# Patient Record
Sex: Male | Born: 1985 | Race: White | Hispanic: No | Marital: Single | Smoking: Former smoker
Health system: Southern US, Community
[De-identification: ages and names within clinical notes are randomized; demographics above are authoritative.]

## PROBLEM LIST (undated history)

## (undated) DIAGNOSIS — G8929 Other chronic pain: Secondary | ICD-10-CM

## (undated) HISTORY — PX: TONSILLECTOMY: SUR1361

---

## 2013-05-16 ENCOUNTER — Encounter (HOSPITAL_COMMUNITY): Payer: Self-pay | Admitting: Emergency Medicine

## 2013-05-16 ENCOUNTER — Emergency Department (INDEPENDENT_AMBULATORY_CARE_PROVIDER_SITE_OTHER)
Admission: EM | Admit: 2013-05-16 | Discharge: 2013-05-16 | Disposition: A | Discharge status: Home / Self Care | Payer: Self-pay | Source: Home / Self Care | Attending: Emergency Medicine | Admitting: Emergency Medicine

## 2013-05-16 ENCOUNTER — Emergency Department (INDEPENDENT_AMBULATORY_CARE_PROVIDER_SITE_OTHER): Payer: Self-pay

## 2013-05-16 DIAGNOSIS — J209 Acute bronchitis, unspecified: Secondary | ICD-10-CM

## 2013-05-16 IMAGING — CR DG CHEST 2V
2 series · 2 of 2 positions shown · non-contrast
Comparison: None.

CLINICAL DATA: Coughing, congestion

EXAM:
CHEST  2 VIEW

[view not recorded (1 of 2)]
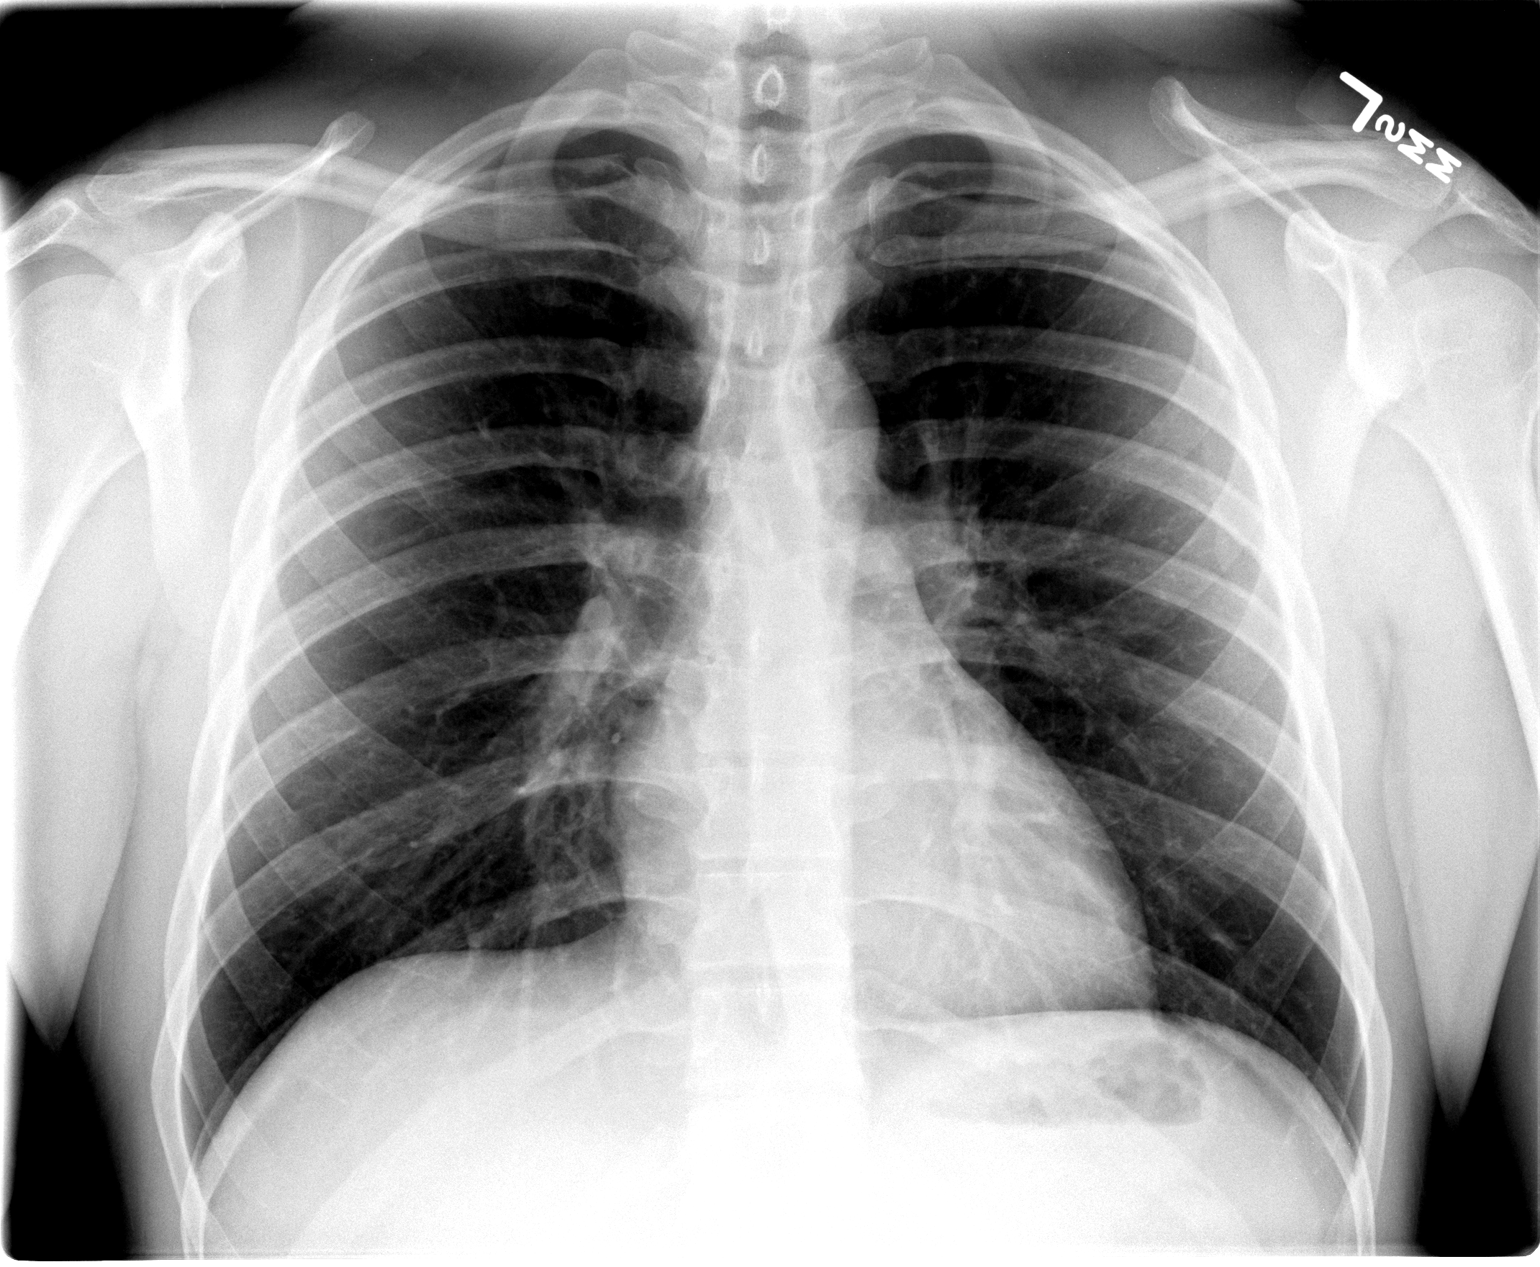

[view not recorded (2 of 2)]
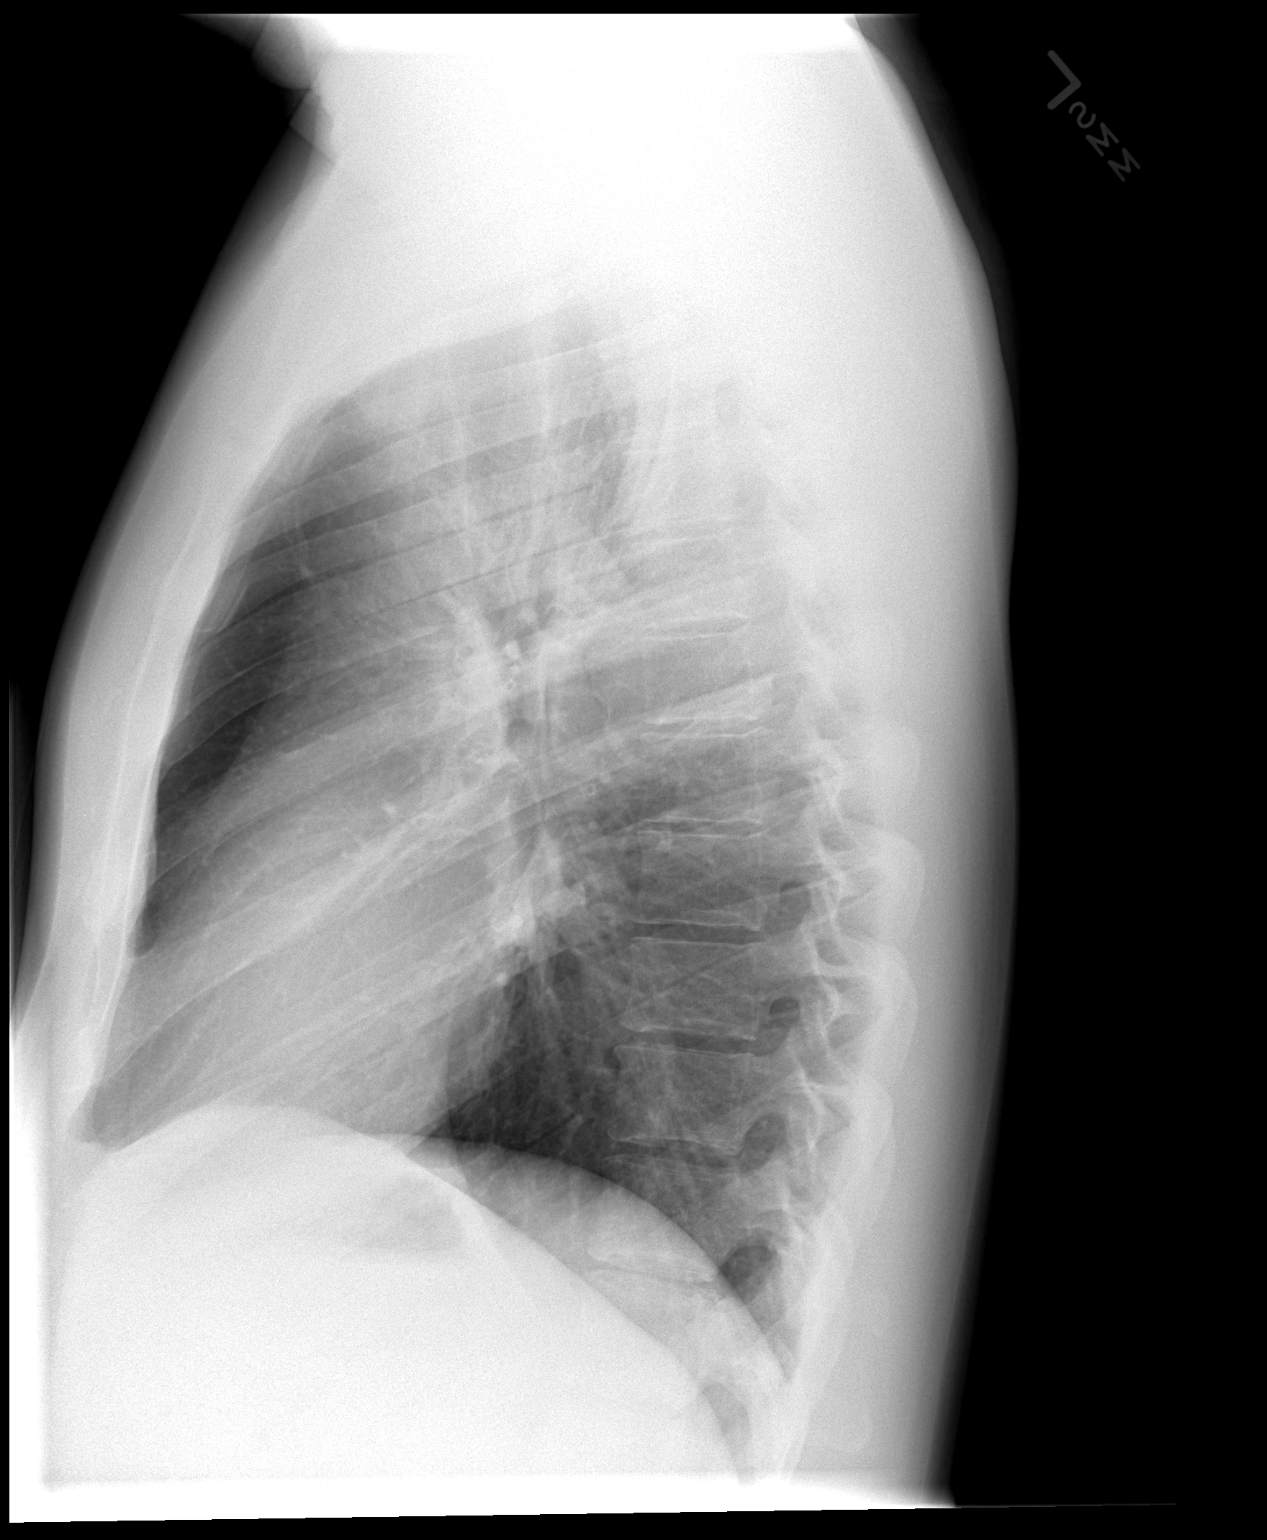

[2 of 2 positions shown; findings below may reference images not displayed]

FINDINGS: The heart size and mediastinal contours are within normal limits.
Both lungs are clear. The visualized skeletal structures are
unremarkable.
IMPRESSION: No active cardiopulmonary disease.

## 2013-05-16 MED ORDER — ALBUTEROL SULFATE HFA 108 (90 BASE) MCG/ACT IN AERS: 1.0000 | INHALATION_SPRAY | Freq: Four times a day (QID) | RESPIRATORY_TRACT | Status: DC | PRN

## 2013-05-16 MED ORDER — IPRATROPIUM BROMIDE 0.06 % NA SOLN: 2.0000 | Freq: Four times a day (QID) | NASAL | Status: DC

## 2013-05-16 MED ORDER — GUAIFENESIN-CODEINE 100-10 MG/5ML PO SYRP: 10.0000 mL | ORAL_SOLUTION | Freq: Four times a day (QID) | ORAL | Status: DC | PRN

## 2013-05-16 MED ORDER — AZITHROMYCIN 250 MG PO TABS: ORAL_TABLET | ORAL | Status: DC

## 2013-05-16 MED ORDER — PREDNISONE 20 MG PO TABS: 20.0000 mg | ORAL_TABLET | Freq: Two times a day (BID) | ORAL | Status: DC

## 2013-05-16 NOTE — ED Notes (Signed)
Pt  Reports  Symptoms  Of  Cough   /  Congested    With  Sinus  Drainage         X  1  Week   Pt   Is  Awake  And  Alert  And  Oriented and  Appears in no  Acute  Distress

## 2013-05-16 NOTE — Discharge Instructions (Signed)
Most upper respiratory infections are caused by viruses and do not require antibiotics.  We try to save the antibiotics for when we really need them to prevent bacteria from developing resistance to them.  Here are a few hints about things that can be done at home to help get over an upper respiratory infection quicker: ° °Get extra sleep and extra fluids.  Get 7 to 9 hours of sleep per night and 6 to 8 glasses of water a day.  Getting extra sleep keeps the immune system from getting run down.  Most people with an upper respiratory infection are a little dehydrated.  The extra fluids also keep the secretions liquified and easier to deal with.  Also, get extra vitamin C.  4000 mg per day is the recommended dose. °For the aches, headache, and fever, acetaminophen or ibuprofen are helpful.  These can be alternated every 4 hours.  People with liver disease should avoid large amounts of acetaminophen, and people with ulcer disease, gastroesophageal reflux, gastritis, congestive heart failure, chronic kidney disease, coronary artery disease and the elderly should avoid ibuprofen. °For nasal congestion try Mucinex-D, or if you're having lots of sneezing or clear nasal drainage use Zyrtec-D. People with high blood pressure can take these if their blood pressure is controlled, if not, it's best to avoid the forms with a "D" (decongestants).  You can use the plain Mucinex, Allegra, Claritin, or Zyrtec even if your blood pressure is not controlled.   °A Saline nasal spray such as Ocean Spray can also help.  You can add a decongestant sprays such as Afrin, but you should not use the decongestant sprays for more than 3 or 4 days since they can be habituating.  Breathe Rite nasal strips can also offer a non-drug alternative treatment to nasal congestion, especially at night. °For people with symptoms of sinusitis, sleeping with your head elevated can be helpful.  For sinus pain, moist, hot compresses to the face may provide some  relief.  Many people find that inhaling steam as in a shower or from a pot of steaming water can help. °For any viral infection, zinc containing lozenges such as Cold-Eze or Zicam are helpful.  Zinc helps to fight viral infection.  Hot salt water gargles (8 oz of hot water, 1/2 tsp of table salt, and a pinch of baking soda) can give relief as well as hot beverages such as hot tea.  Sucrets extra strength lozenges will help the sore throat.  °For the cough, take Delsym 2 tsp every 12 hours.  It has also been found recently that Aleve can help control a cough.  The dose is 1 to 2 tablets twice daily with food.  This can be combined with Delsym. (Note, if you are taking ibuprofen, you should not take Aleve as well--take one or the other.) °A cool mist vaporizer will help keep your mucous membranes from drying out.  ° °It's important when you have an upper respiratory infection not to pass the infection to others.  This involves being very careful about the following: ° °Frequent hand washing or use of hand sanitizer, especially after coughing, sneezing, blowing your nose or touching your face, nose or eyes. °Do not shake hands or touch anyone and try to avoid touching surfaces that other people use such as doorknobs, shopping carts, telephones and computer keyboards. °Use tissues and dispose of them properly in a garbage can or ziplock bag. °Cough into your sleeve. °Do not let others eat or   drink after you. ° °It's also important to recognize the signs of serious illness and get evaluated if they occur: °Any respiratory infection that lasts more than 7 to 10 days.  Yellow nasal drainage and sputum are not reliable indicators of a bacterial infection, but if they last for more than 1 week, see your doctor. °Fever and sore throat can indicate strep. °Fever and cough can indicate influenza or pneumonia. °Any kind of severe symptom such as difficulty breathing, intractable vomiting, or severe pain should prompt you to see  a doctor as soon as possible. ° ° °Your body's immune system is really the thing that will get rid of this infection.  Your immune system is comprised of 2 types of specialized cells called T cells and B cells.  T cells coordinate the array of cells in your body that engulf invading bacteria or viruses while B cells orchestrate the production of antibodies that neutralize infection.  Anything we do or any medications we give you, will just strengthen your immune system or help it clear up the infection quicker.  Here are a few helpful hints to improve your immune system to help overcome this illness or to prevent future infections: °· A few vitamins can improve the health of your immune system.  That's why your diet should include plenty of fruits, vegetables, fish, nuts, and whole grains. °· Vitamin A and bet-carotene can increase the cells that fight infections (T cells and B cells).  Vitamin A is abundant in dark greens and orange vegetables such as spinach, greens, sweet potatoes, and carrots. °· Vitamin B6 contributes to the maturation of white blood cells, the cells that fight disease.  Foods with vitamin B6 include cold cereal and bananas. °· Vitamin C is credited with preventing colds because it increases white blood cells and also prevents cellular damage.  Citrus fruits, peaches and green and red bell peppers are all hight in vitamin C. °· Vitamin E is an anti-oxidant that encourages the production of natural killer cells which reject foreign invaders and B cells that produce antibodies.  Foods high in vitamin E include wheat germ, nuts and seeds. °· Foods high in omega-3 fatty acids found in foods like salmon, tuna and mackerel boost your immune system and help cells to engulf and absorb germs. °· Probiotics are good bacteria that increase your T cells.  These can be found in yogurt and are available in supplements such as Culturelle or Align. °· Moderate exercise increases the strength of your immune  system and your ability to recover from illness.  I suggest 3 to 5 moderate intensity 30 minute workouts per week.   °· Sleep is another component of maintaining a strong immune system.  It enables your body to recuperate from the day's activities, stress and work.  My recommendation is to get between 7 and 9 hours of sleep per night. °· If you smoke, try to quit completely or at least cut down.  Drink alcohol only in moderation if at all.  No more than 2 drinks daily for men or 1 for women. °· Get a flu vaccine early in the fall or if you have not gotten one yet, once this illness has run its course.  If you are over 65, a smoker, or an asthmatic, get a pneumococcal vaccine. °· My final recommendation is to maintain a healthy weight.  Excess weight can impair the immune system by interfering with the way the immune system deals with invading viruses or   bacteria. ° ° °Bronchitis °Bronchitis is inflammation of the airways that extend from the windpipe into the lungs (bronchi). The inflammation often causes mucus to develop, which leads to a cough. If the inflammation becomes severe, it may cause shortness of breath. °CAUSES  °Bronchitis may be caused by:  °· Viral infections.   °· Bacteria.   °· Cigarette smoke.   °· Allergens, pollutants, and other irritants.   °SIGNS AND SYMPTOMS  °The most common symptom of bronchitis is a frequent cough that produces mucus. Other symptoms include: °· Fever.   °· Body aches.   °· Chest congestion.   °· Chills.   °· Shortness of breath.   °· Sore throat.   °DIAGNOSIS  °Bronchitis is usually diagnosed through a medical history and physical exam. Tests, such as chest X-rays, are sometimes done to rule out other conditions.  °TREATMENT  °You may need to avoid contact with whatever caused the problem (smoking, for example). Medicines are sometimes needed. These may include: °· Antibiotics. These may be prescribed if the condition is caused by bacteria. °· Cough suppressants. These may  be prescribed for relief of cough symptoms.   °· Inhaled medicines. These may be prescribed to help open your airways and make it easier for you to breathe.   °· Steroid medicines. These may be prescribed for those with recurrent (chronic) bronchitis. °HOME CARE INSTRUCTIONS °· Get plenty of rest.   °· Drink enough fluids to keep your urine clear or pale yellow (unless you have a medical condition that requires fluid restriction). Increasing fluids may help thin your secretions and will prevent dehydration.   °· Only take over-the-counter or prescription medicines as directed by your health care provider. °· Only take antibiotics as directed. Make sure you finish them even if you start to feel better. °· Avoid secondhand smoke, irritating chemicals, and strong fumes. These will make bronchitis worse. If you are a smoker, quit smoking. Consider using nicotine gum or skin patches to help control withdrawal symptoms. Quitting smoking will help your lungs heal faster.   °· Put a cool-mist humidifier in your bedroom at night to moisten the air. This may help loosen mucus. Change the water in the humidifier daily. You can also run the hot water in your shower and sit in the bathroom with the door closed for 5 10 minutes.   °· Follow up with your health care provider as directed.   °· Wash your hands frequently to avoid catching bronchitis again or spreading an infection to others.   °SEEK MEDICAL CARE IF: °Your symptoms do not improve after 1 week of treatment.  °SEEK IMMEDIATE MEDICAL CARE IF: °· Your fever increases. °· You have chills.   °· You have chest pain.   °· You have worsening shortness of breath.   °· You have bloody sputum. °· You faint.   °· You have lightheadedness. °· You have a severe headache.   °· You vomit repeatedly. °MAKE SURE YOU:  °· Understand these instructions. °· Will watch your condition. °· Will get help right away if you are not doing well or get worse. °Document Released: 03/07/2005  Document Revised: 12/26/2012 Document Reviewed: 10/30/2012 °[employer]® Patient Information ©2014 [employer]. ° °

## 2013-05-16 NOTE — ED Provider Notes (Signed)
Chief Complaint   Chief Complaint  Patient presents with  . URI    History of Present Illness   Scott Green is a 28 year old male who's had a one-week history of cough productive yellow-green sputum with blood, chest tightness, wheezing, posttussive vomiting. He's felt hot and feels like she's about to pass out. He's had nasal congestion with small amounts of clear drainage, headache, sinus pressure, and his ears and popping. He denies any specific sick exposures.  Review of Systems   Other than as noted above, the patient denies any of the following symptoms: Systemic:  No fevers, chills, sweats, or myalgias. Eye:  No redness or discharge. ENT:  No ear pain, headache, nasal congestion, drainage, sinus pressure, or sore throat. Neck:  No neck pain, stiffness, or swollen glands. Lungs:  No cough, sputum production, hemoptysis, wheezing, chest tightness, shortness of breath or chest pain. GI:  No abdominal pain, nausea, vomiting or diarrhea.  PMFSH   Past medical history, family history, social history, meds, and allergies were reviewed.   Physical exam   Vital signs:  BP 110/80  Pulse 84  Temp(Src) 98.3 F (36.8 C) (Oral)  Resp 18  SpO2 98% General:  Alert and oriented.  In no distress.  Skin warm and dry. Eye:  No conjunctival injection or drainage. Lids were normal. ENT:  TMs and canals were normal, without erythema or inflammation.  Nasal mucosa was clear and uncongested, without drainage.  Mucous membranes were moist.  Pharynx was clear with no exudate or drainage.  There were no oral ulcerations or lesions. Neck:  Supple, no adenopathy, tenderness or mass. Lungs:  No respiratory distress.  He has bilateral, scattered expiratory wheezes.  Heart:  Regular rhythm, without gallops, murmers or rubs. Skin:  Clear, warm, and dry, without rash or lesions.  Radiology   Dg Chest 2 View  05/16/2013   CLINICAL DATA:  Coughing, congestion  EXAM: CHEST  2 VIEW  COMPARISON:   None.  FINDINGS: The heart size and mediastinal contours are within normal limits. Both lungs are clear. The visualized skeletal structures are unremarkable.  IMPRESSION: No active cardiopulmonary disease.   Electronically Signed   By: Ruel Favors M.D.   On: 05/16/2013 14:41   Assessment     The encounter diagnosis was Acute bronchitis.  Plan    1.  Meds:  The following meds were prescribed:   Discharge Medication List as of 05/16/2013  3:08 PM    START taking these medications   Details  albuterol (PROVENTIL HFA;VENTOLIN HFA) 108 (90 BASE) MCG/ACT inhaler Inhale 1-2 puffs into the lungs every 6 (six) hours as needed for wheezing or shortness of breath., Starting 05/16/2013, Until Discontinued, Normal    azithromycin (ZITHROMAX Z-PAK) 250 MG tablet Take as directed., Normal    guaiFENesin-codeine (GUIATUSS AC) 100-10 MG/5ML syrup Take 10 mLs by mouth 4 (four) times daily as needed for cough., Starting 05/16/2013, Until Discontinued, Print    ipratropium (ATROVENT) 0.06 % nasal spray Place 2 sprays into both nostrils 4 (four) times daily., Starting 05/16/2013, Until Discontinued, Normal    predniSONE (DELTASONE) 20 MG tablet Take 1 tablet (20 mg total) by mouth 2 (two) times daily., Starting 05/16/2013, Until Discontinued, Normal        2.  Patient Education/Counseling:  The patient was given appropriate handouts, self care instructions, and instructed in symptomatic relief.  Instructed to get extra fluids, rest, and use a cool mist vaporizer.    3.  Follow up:  The patient was told to follow up here if no better in 3 to 4 days, or sooner if becoming worse in any way, and given some red flag symptoms such as increasing fever, difficulty breathing, chest pain, or persistent vomiting which would prompt immediate return.  Follow up here as needed.      Reuben Likesavid C Infant Doane, MD 05/16/13 2121

## 2013-12-15 ENCOUNTER — Emergency Department (HOSPITAL_COMMUNITY)
Admission: EM | Admit: 2013-12-15 | Discharge: 2013-12-16 | Disposition: A | Discharge status: Home / Self Care | Payer: Self-pay | Attending: Emergency Medicine | Admitting: Emergency Medicine

## 2013-12-15 ENCOUNTER — Encounter (HOSPITAL_COMMUNITY): Payer: Self-pay | Admitting: Emergency Medicine

## 2013-12-15 DIAGNOSIS — F1920 Other psychoactive substance dependence, uncomplicated: Secondary | ICD-10-CM | POA: Diagnosis present

## 2013-12-15 DIAGNOSIS — F411 Generalized anxiety disorder: Secondary | ICD-10-CM | POA: Insufficient documentation

## 2013-12-15 DIAGNOSIS — F1910 Other psychoactive substance abuse, uncomplicated: Secondary | ICD-10-CM

## 2013-12-15 DIAGNOSIS — F1510 Other stimulant abuse, uncomplicated: Secondary | ICD-10-CM | POA: Insufficient documentation

## 2013-12-15 DIAGNOSIS — F1210 Cannabis abuse, uncomplicated: Secondary | ICD-10-CM | POA: Insufficient documentation

## 2013-12-15 DIAGNOSIS — F1310 Sedative, hypnotic or anxiolytic abuse, uncomplicated: Secondary | ICD-10-CM | POA: Insufficient documentation

## 2013-12-15 DIAGNOSIS — IMO0002 Reserved for concepts with insufficient information to code with codable children: Secondary | ICD-10-CM | POA: Insufficient documentation

## 2013-12-15 DIAGNOSIS — Z87891 Personal history of nicotine dependence: Secondary | ICD-10-CM | POA: Insufficient documentation

## 2013-12-15 DIAGNOSIS — F1410 Cocaine abuse, uncomplicated: Secondary | ICD-10-CM | POA: Insufficient documentation

## 2013-12-15 LAB — RAPID URINE DRUG SCREEN, HOSP PERFORMED
Amphetamines: POSITIVE — AB
BARBITURATES: NOT DETECTED
BENZODIAZEPINES: POSITIVE — AB
COCAINE: NOT DETECTED
Opiates: NOT DETECTED
Tetrahydrocannabinol: POSITIVE — AB

## 2013-12-15 LAB — COMPREHENSIVE METABOLIC PANEL
ALT: 195 U/L — AB (ref 0–53)
AST: 66 U/L — ABNORMAL HIGH (ref 0–37)
Albumin: 4.9 g/dL (ref 3.5–5.2)
Alkaline Phosphatase: 57 U/L (ref 39–117)
Anion gap: 20 — ABNORMAL HIGH (ref 5–15)
BUN: 24 mg/dL — AB (ref 6–23)
CALCIUM: 10.1 mg/dL (ref 8.4–10.5)
CO2: 20 meq/L (ref 19–32)
Chloride: 99 mEq/L (ref 96–112)
Creatinine, Ser: 1.2 mg/dL (ref 0.50–1.35)
GFR, EST NON AFRICAN AMERICAN: 82 mL/min — AB (ref >90)
GLUCOSE: 101 mg/dL — AB (ref 70–99)
Potassium: 3.8 mEq/L (ref 3.7–5.3)
SODIUM: 139 meq/L (ref 137–147)
Total Bilirubin: 0.7 mg/dL (ref 0.3–1.2)
Total Protein: 8.8 g/dL — ABNORMAL HIGH (ref 6.0–8.3)

## 2013-12-15 LAB — ETHANOL: Alcohol, Ethyl (B): <11 mg/dL (ref 0–11)

## 2013-12-15 LAB — SALICYLATE LEVEL

## 2013-12-15 LAB — ACETAMINOPHEN LEVEL: Acetaminophen (Tylenol), Serum: <15 ug/mL (ref 10–30)

## 2013-12-15 MED ORDER — ONDANSETRON HCL 4 MG PO TABS: 4.0000 mg | ORAL_TABLET | Freq: Three times a day (TID) | ORAL | Status: DC | PRN

## 2013-12-15 MED ORDER — NICOTINE 21 MG/24HR TD PT24
21.0000 mg | MEDICATED_PATCH | Freq: Every day | TRANSDERMAL | Status: DC
  Administered 2013-12-16: 21 mg via TRANSDERMAL
  Filled 2013-12-15 (×2): qty 1

## 2013-12-15 MED ORDER — IBUPROFEN 200 MG PO TABS
600.0000 mg | ORAL_TABLET | Freq: Three times a day (TID) | ORAL | Status: DC | PRN
  Administered 2013-12-16: 600 mg via ORAL
  Filled 2013-12-15: qty 3

## 2013-12-15 MED ORDER — LORAZEPAM 1 MG PO TABS
1.0000 mg | ORAL_TABLET | Freq: Three times a day (TID) | ORAL | Status: DC | PRN
  Administered 2013-12-15: 1 mg via ORAL
  Filled 2013-12-15: qty 1

## 2013-12-15 MED ORDER — ALUM & MAG HYDROXIDE-SIMETH 200-200-20 MG/5ML PO SUSP: 30.0000 mL | ORAL | Status: DC | PRN

## 2013-12-15 MED ORDER — ACETAMINOPHEN 325 MG PO TABS: 650.0000 mg | ORAL_TABLET | ORAL | Status: DC | PRN

## 2013-12-15 MED ORDER — ZOLPIDEM TARTRATE 5 MG PO TABS
5.0000 mg | ORAL_TABLET | Freq: Every evening | ORAL | Status: DC | PRN
  Administered 2013-12-16: 5 mg via ORAL
  Filled 2013-12-15: qty 1

## 2013-12-15 NOTE — ED Notes (Signed)
Patient wife and mother are concerned patient is using Meth and additional drugs that patient is not disclosing. Patient wife tearful when discussing concerns for safety of self or child but denies concerns.

## 2013-12-15 NOTE — ED Notes (Signed)
PA at bedside.

## 2013-12-15 NOTE — ED Notes (Addendum)
Patient states his is here for drug treatment. Patient states he smokes marijuana and uses "uppers". Patient states he had a daughter born on 12/02/2013 and this is his motivation for treatment. Patient states he went through a detox program @ 5years ago at [facility]. Patient is speaking fast and unable to focus on his thoughts. Patient denies SI/HI. Patient reports he is currently enrolled in court ordered anger management classes (last class scheduled for Tuesday). Patient states he feels like these have been ineffective and would also like to talk to someone about anger management. Patient states he last used at noon.

## 2013-12-15 NOTE — ED Provider Notes (Signed)
CSN: [id number]     Arrival date & time 12/15/13  2012 History   First MD Initiated Contact with Patient 12/15/13 2136     This chart was scribed for non-physician practitioner, Antony Madura, PA-C working with No att. providers found by Arlan Organ, ED Scribe. This patient was seen in room WA32/WA32 and the patient's care was started at 4:47 AM.   Chief Complaint  Patient presents with  . drug detox    The history is provided by the patient and the spouse. No language interpreter was used.    HPI Comments: Scott Green is a 28 y.o. male who presents to the Emergency Department here for drug detox today. Pt admits to occasional cocaine use along with Marijuana use. States he snorts both Adderall and Cocaine. Last use of Xanex, Adderall, and Marijuana earlier today. Mr. Justiss went through a detox program approximately 5 years ago at [facility]. When not using he denies any withdrawal or worsening anger. However, he states he feels more agitated when using cocaine. Pt and his wife recently had first child 9/14 which is motivating him to seek treatment. He also admits to anger issues. He states he often takes his anger out on his wife. He is currently enrolled in a court ordered anger management classes; last class scheduled for 9/29. No SI/HI. No other illicit drug use.  History reviewed. No pertinent past medical history. History reviewed. No pertinent past surgical history. No family history on file. History  Substance Use Topics  . Smoking status: Former Games developer  . Smokeless tobacco: Current User    Types: Snuff  . Alcohol Use: 7.2 oz/week    12 Cans of beer per week    Review of Systems  Psychiatric/Behavioral: Positive for behavioral problems, decreased concentration and agitation. Negative for suicidal ideas. The patient is nervous/anxious.   All other systems reviewed and are negative.   Allergies  Tramadol  Home Medications   Prior to Admission medications   Not on File    Triage Vitals: BP 101/78  Pulse 112  Temp(Src) 98.2 F (36.8 C) (Oral)  Resp 16  Ht  (1.702 m)  Wt 172 lb 9.6 oz (78.291 kg)  BMI 27.03 kg/m2  SpO2 99%   Physical Exam  Nursing note and vitals reviewed. Constitutional: He is oriented to person, place, and time. He appears well-developed and well-nourished. No distress.  HENT:  Head: Normocephalic and atraumatic.  Eyes: Conjunctivae and EOM are normal. No scleral icterus.  Neck: Normal range of motion. Neck supple.  Pulmonary/Chest: Effort normal. No respiratory distress.  Musculoskeletal: Normal range of motion.  Neurological: He is alert and oriented to person, place, and time. He exhibits normal muscle tone. Coordination normal.  GCS 15. Speech is goal oriented. Patient moves extremities without ataxia.  Skin: Skin is warm and dry. No rash noted. He is not diaphoretic. No erythema. No pallor.  Psychiatric: His mood appears anxious. His speech is rapid and/or pressured. He is agitated. Cognition and memory are normal. He expresses no homicidal and no suicidal ideation. He expresses no suicidal plans and no homicidal plans.    ED Course  Procedures (including critical care time)  DIAGNOSTIC STUDIES: Oxygen Saturation is 99% on RA, Normal by my interpretation.    COORDINATION OF CARE: 4:47 AM- Will order CBC, CMP, urine rapid drug screen, ethanol, acetaminophen levels, and salicylate levels. Discussed treatment plan with pt at bedside and pt agreed to plan.     Labs Review Labs Reviewed  CBC WITH DIFFERENTIAL - Abnormal; Notable for the following:    WBC 12.9 (*)    Neutro Abs 8.1 (*)    Monocytes Absolute 1.4 (*)    All other components within normal limits  COMPREHENSIVE METABOLIC PANEL - Abnormal; Notable for the following:    Glucose, Bld 101 (*)    BUN 24 (*)    Total Protein 8.8 (*)    AST 66 (*)    ALT 195 (*)    GFR calc non Af Amer 82 (*)    Anion gap 20 (*)    All other components within normal  limits  URINE RAPID DRUG SCREEN (HOSP PERFORMED) - Abnormal; Notable for the following:    Benzodiazepines POSITIVE (*)    Amphetamines POSITIVE (*)    Tetrahydrocannabinol POSITIVE (*)    All other components within normal limits  SALICYLATE LEVEL - Abnormal; Notable for the following:    Salicylate Lvl <2.0 (*)    All other components within normal limits  ETHANOL  ACETAMINOPHEN LEVEL    Imaging Review No results found.   EKG Interpretation None      MDM   Final diagnoses:  Polysubstance abuse  Difficulty controlling anger    28 year old male presents to the emergency department for further evaluation of polysubstance abuse. Patient also with history of anger outbursts and difficulty controlling his anger. Patient has been going to anger management, but this has not been helping him. Patient endorses only using marijuana and Adderall or Xanax. Girlfriend suspects use of methamphetamines. Patient denies this; however, UDS today is positive for amphetamines. Patient denies SI/HI. No alcohol use. Patient has been evaluated by TTS and meets criteria for inpatient treatment. Patient pending placement at this time. Anion gap elevated; electrolytes and bicarb normal. Believe patient can be medically cleared if anion gap on repeat chem-8 improves. Disposition to be determined by oncoming ED provider.  I personally performed the services described in this documentation, which was scribed in my presence. The recorded information has been reviewed and is accurate.    Filed Vitals:   12/15/13 2028  BP: 101/78  Pulse: 112  Temp: 98.2 F (36.8 C)  TempSrc: Oral  Resp: 16  Height:  (1.702 m)  Weight: 172 lb 9.6 oz (78.291 kg)  SpO2: 99%     Antony Madura, PA-C 12/16/13 0451

## 2013-12-15 NOTE — ED Notes (Signed)
Per pt., brother Sabatino Williard needs to be called when pt is moved and/or dispo'd. [phone] (c)

## 2013-12-15 NOTE — ED Notes (Signed)
TTS at bedside. 

## 2013-12-16 ENCOUNTER — Encounter (HOSPITAL_COMMUNITY): Payer: Self-pay | Admitting: Psychiatry

## 2013-12-16 DIAGNOSIS — F1920 Other psychoactive substance dependence, uncomplicated: Secondary | ICD-10-CM | POA: Diagnosis present

## 2013-12-16 DIAGNOSIS — F1910 Other psychoactive substance abuse, uncomplicated: Secondary | ICD-10-CM

## 2013-12-16 LAB — I-STAT CHEM 8, ED
BUN: 28 mg/dL — AB (ref 6–23)
Calcium, Ion: 1.14 mmol/L (ref 1.12–1.23)
Chloride: 103 mEq/L (ref 96–112)
Creatinine, Ser: 1.2 mg/dL (ref 0.50–1.35)
Glucose, Bld: 125 mg/dL — ABNORMAL HIGH (ref 70–99)
HCT: 46 % (ref 39.0–52.0)
Hemoglobin: 15.6 g/dL (ref 13.0–17.0)
POTASSIUM: 3.3 meq/L — AB (ref 3.7–5.3)
SODIUM: 138 meq/L (ref 137–147)
TCO2: 26 mmol/L (ref 0–100)

## 2013-12-16 LAB — CBC WITH DIFFERENTIAL/PLATELET
BASOS PCT: 0 % (ref 0–1)
Basophils Absolute: 0 10*3/uL (ref 0.0–0.1)
Eosinophils Absolute: 0 10*3/uL (ref 0.0–0.7)
Eosinophils Relative: 0 % (ref 0–5)
HCT: 46.7 % (ref 39.0–52.0)
HEMOGLOBIN: 16.7 g/dL (ref 13.0–17.0)
Lymphocytes Relative: 26 % (ref 12–46)
Lymphs Abs: 3.4 10*3/uL (ref 0.7–4.0)
MCH: 31.9 pg (ref 26.0–34.0)
MCHC: 35.8 g/dL (ref 30.0–36.0)
MCV: 89.1 fL (ref 78.0–100.0)
MONO ABS: 1.4 10*3/uL — AB (ref 0.1–1.0)
Monocytes Relative: 11 % (ref 3–12)
NEUTROS PCT: 63 % (ref 43–77)
Neutro Abs: 8.1 10*3/uL — ABNORMAL HIGH (ref 1.7–7.7)
Platelets: 224 10*3/uL (ref 150–400)
RBC: 5.24 MIL/uL (ref 4.22–5.81)
RDW: 13.2 % (ref 11.5–15.5)
WBC: 12.9 10*3/uL — ABNORMAL HIGH (ref 4.0–10.5)

## 2013-12-16 MED ORDER — POTASSIUM CHLORIDE CRYS ER 20 MEQ PO TBCR
40.0000 meq | EXTENDED_RELEASE_TABLET | Freq: Once | ORAL | Status: AC
  Administered 2013-12-16: 40 meq via ORAL
  Filled 2013-12-16: qty 2

## 2013-12-16 MED ORDER — LORAZEPAM 1 MG PO TABS
1.0000 mg | ORAL_TABLET | Freq: Once | ORAL | Status: AC
  Administered 2013-12-16: 1 mg via ORAL
  Filled 2013-12-16: qty 1

## 2013-12-16 NOTE — Progress Notes (Signed)
Belongings in locker 32 

## 2013-12-16 NOTE — ED Notes (Signed)
Pt discharged home with gf; given all information for rehab and detox; stated understanding; no issues at time of discharge

## 2013-12-16 NOTE — Discharge Instructions (Signed)
Chemical Dependency Chemical dependency is an addiction to drugs or alcohol. It is characterized by the repeated behavior of seeking out and using drugs and alcohol despite harmful consequences to the health and safety of ones self and others.  RISK FACTORS There are certain situations or behaviors that increase a person's risk for chemical dependency. These include:  A family history of chemical dependency.  A history of mental health issues, including depression and anxiety.  A home environment where drugs and alcohol are easily available to you.  Drug or alcohol use at a young age. SYMPTOMS  The following symptoms can indicate chemical dependency:  Inability to limit the use of drugs or alcohol.  Nausea, sweating, shakiness, and anxiety that occurs when alcohol or drugs are not being used.  An increase in amount of drugs or alcohol that is necessary to get drunk or high. People who experience these symptoms can assess their use of drugs and alcohol by asking themselves the following questions:  Have you been told by friends or family that they are worried about your use of alcohol or drugs?  Do friends and family ever tell you about things you did while drinking alcohol or using drugs that you do not remember?  Do you lie about using alcohol or drugs or about the amounts you use?  Do you have difficulty completing daily tasks unless you use alcohol or drugs?  Is the level of your work or school performance lower because of your drug or alcohol use?  Do you get sick from using drugs or alcohol but keep using anyway?  Do you feel uncomfortable in social situations unless you use alcohol or drugs?  Do you use drugs or alcohol to help forget problems? An answer of yes to any of these questions may indicate chemical dependency. Professional evaluation is suggested. Document Released: 03/01/2001 Document Revised: 05/30/2011 Document Reviewed: 05/13/2010 [facility] Patient  Information 2015 [place], [place]. This information is not intended to replace advice given to you by your health care provider. Make sure you discuss any questions you have with your health care provider.  Finding Treatment for Alcohol and Drug Addiction It can be hard to find the right place to get professional treatment. Here are some important things to consider:  There are different types of treatment to choose from.  Some programs are live-in (residential) while others are not (outpatient). Sometimes a combination is offered.  No single type of program is right for everyone.  Most treatment programs involve a combination of education, counseling, and a 12-step, spiritually-based approach.  There are non-spiritually based programs (not 12-step).  Some treatment programs are government sponsored. They are geared for patients without private insurance.  Treatment programs can vary in many respects such as:  Cost and types of insurance accepted.  Types of on-site medical services offered.  Length of stay, setting, and size.  Overall philosophy of treatment. A person may need specialized treatment or have needs not addressed by all programs. For example, adolescents need treatment appropriate for their age. Other people have secondary disorders that must be managed as well. Secondary conditions can include mental illness, such as depression or diabetes. Often, a period of detoxification from alcohol or drugs is needed. This requires medical supervision and not all programs offer this. THINGS TO CONSIDER WHEN SELECTING A TREATMENT PROGRAM   Is the program certified by the appropriate government agency? Even private programs must be certified and employ certified professionals.  Does the program accept your insurance?  If not, can a payment plan be set up?  Is the facility clean, organized, and well run? Do they allow you to speak with graduates who can share their treatment experience  with you? Can you tour the facility? Can you meet with staff?  Does the program meet the full range of individual needs?  Does the treatment program address sexual orientation and physical disabilities? Do they provide age, gender, and culturally appropriate treatment services?  Is treatment available in languages other than English?  Is long-term aftercare support or guidance encouraged and provided?  Is assessment of an individual's treatment plan ongoing to ensure it meets changing needs?  Does the program use strategies to encourage reluctant patients to remain in treatment long enough to increase the likelihood of success?  Does the program offer counseling (individual or group) and other behavioral therapies?  Does the program offer medicine as part of the treatment regimen, if needed?  Is there ongoing monitoring of possible relapse? Is there a defined relapse prevention program? Are services or referrals offered to family members to ensure they understand addiction and the recovery process? This would help them support the recovering individual.  Are 12-step meetings held at the center or is transport available for patients to attend outside meetings? In countries outside of the [place]. and [place], Magazine features editor for contact information for services in your area. Document Released: 02/03/2005 Document Revised: 05/30/2011 Document Reviewed: 08/16/2007 [facility] Patient Information 2015 [place], [place]. This information is not intended to replace advice given to you by your health care provider. Make sure you discuss any questions you have with your health care provider.

## 2013-12-16 NOTE — [facility] Counselor (Signed)
Pt is being d/c. Writer gave pt list of outpatient and inpatient substance abuse treatment centers.  Evette Cristal, [place] Assessment Counselor

## 2013-12-16 NOTE — [facility] Assessment (Signed)
Assessment completed. Consulted with Nanine Means, NP who recommended inpatient treatment. [facility] at capacity. TTS will contact other facilities for placement. Informed Antony Madura, PA-C of recommendation.

## 2013-12-16 NOTE — [facility] Suicide Risk Assessment (Signed)
Suicide Risk Assessment  Discharge Assessment     Demographic Factors:  Male and Caucasian  Total Time spent with patient: 20 minutes  Psychiatric Specialty Exam:     Blood pressure 102/68, pulse 102, temperature 97.9 F (36.6 C), temperature source Oral, resp. rate 16, height  (1.702 m), weight 172 lb 9.6 oz (78.291 kg), SpO2 99.00%.Body mass index is 27.03 kg/(m^2).  General Appearance: Casual  Eye Contact::  Good  Speech:  Normal Rate  Volume:  Normal  Mood:  Euthymic  Affect:  Congruent  Thought Process:  Coherent  Orientation:  Full (Time, Place, and Person)  Thought Content:  WDL  Suicidal Thoughts:  No  Homicidal Thoughts:  No  Memory:  Immediate;   Good Recent;   Good Remote;   Good  Judgement:  Fair  Insight:  Fair  Psychomotor Activity:  Normal  Concentration:  Good  Recall:  Good  Fund of Knowledge:Good  Language: Good  Akathisia:  No  Handed:  Right  AIMS (if indicated):     Assets:  Communication Skills Desire for Improvement Housing Intimacy Leisure Time Physical Health Resilience Social Support Talents/Skills Transportation Vocational/Educational  Sleep:      Musculoskeletal: Strength & Muscle Tone: within normal limits Gait & Station: normal Patient leans: N/A  Mental Status Per Nursing Assessment::   On Admission:   polysubstance abuse/dependence  Current Mental Status by Physician: NA  Loss Factors: NA  Historical Factors: NA  Risk Reduction Factors:   Responsible for children under 78 years of age, Sense of responsibility to family, Employed, Living with another person, especially a relative and Positive social support  Continued Clinical Symptoms:  Polysubstance abuse  Cognitive Features That Contribute To Risk:  None  Suicide Risk:  Minimal: No identifiable suicidal ideation.  Patients presenting with no risk factors but with morbid ruminations; may be classified as minimal risk based on the severity of the  depressive symptoms  Discharge Diagnoses:   AXIS I:  Polysubstance abuse/dependence AXIS II:  Deferred AXIS III:  History reviewed. No pertinent past medical history. AXIS IV:  problems related to legal system/crime and problems related to social environment AXIS V:  61-70 mild symptoms  Plan Of Care/Follow-up recommendations:  Activity:  as tolerated Diet:  low-sodium heart healthy diet  Is patient on multiple antipsychotic therapies at discharge:  No   Has Patient had three or more failed trials of antipsychotic monotherapy by history:  No  Recommended Plan for Multiple Antipsychotic Therapies: NA    Majid Mccravy, PMH-NP 12/16/2013, 11:12 AM

## 2013-12-16 NOTE — ED Provider Notes (Signed)
Medical screening examination/treatment/procedure(s) were performed by non-physician practitioner and as supervising physician I was immediately available for consultation/collaboration.   EKG Interpretation None      Glynn Octave, MD 12/16/13 [phone]

## 2013-12-16 NOTE — Consult Note (Signed)
[facility] Face-to-Face Psychiatry Consult   Reason for Consult:  Polysubstance abuse/dependence Referring Physician:  EDP  Scott Green is an 28 y.o. male. Total Time spent with patient: 20 minutes  Assessment: AXIS I:  Substance Abuse AXIS II:  Deferred AXIS III:  History reviewed. No pertinent past medical history. AXIS IV:  problems related to legal system/crime and problems related to social environment AXIS V:  61-70 mild symptoms  Plan:  No evidence of imminent risk to self or others at present.  Dr. Darleene Cleaver assessed the patient and concurs with the plan.  Subjective:   Scott Green is a 28 y.o. male patient does not warrant admission.  HPI:  Patient stated he came to the ED because he has a new girlfriend and a new baby he wants to get "clean" for.  He stated he uses marijuana daily, Xanax on the weekend, occasional alcohol use, denies withdrawal symptoms.  He would like rehab resources to get help.  Denies suicidal/homicidal ideations, hallucinations.  He has a court date on Wednesday, 10/30 for charges.   HPI Elements:   Location:  generalized. Quality:  chronic. Severity:  mild. Timing:  intermittent. Duration:  years. Context:  stressors.  Past Psychiatric History: History reviewed. No pertinent past medical history.  reports that he has quit smoking. His smokeless tobacco use includes Snuff. He reports that he drinks about 7.2 ounces of alcohol per week. He reports that he uses illicit drugs (Marijuana). History reviewed. No pertinent family history. Family History Substance Abuse: No Family Supports: Yes, List: (Girlfriend ) Living Arrangements: Spouse/significant other Can pt return to current living arrangement?: Yes Abuse/Neglect [facility]) Physical Abuse: Denies Verbal Abuse: Denies Sexual Abuse: Denies Allergies:   Allergies  Allergen Reactions  . Tramadol Other (See Comments)    Stomach pain    ACT Assessment Complete:  Yes:    Educational Status     Risk to Self: Risk to self with the past 6 months Suicidal Ideation: No Suicidal Intent: No Is patient at risk for suicide?: No Suicidal Plan?: No Access to Means: No What has been your use of drugs/alcohol within the last 12 months?: Daily drug use reported. Denies alcohol use Previous Attempts/Gestures: No How many times?: 0 Other Self Harm Risks: No other self harm risk identified at this time.  Triggers for Past Attempts: None known Intentional Self Injurious Behavior: None Family Suicide History: No Recent stressful life event(s):  (No stressful events reported) Persecutory voices/beliefs?: No Depression: No Depression Symptoms: Insomnia;Feeling angry/irritable;Guilt Substance abuse history and/or treatment for substance abuse?: Yes Suicide prevention information given to non-admitted patients: Not applicable  Risk to Others: Risk to Others within the past 6 months Homicidal Ideation: No Thoughts of Harm to Others: No Current Homicidal Intent: No Current Homicidal Plan: No Access to Homicidal Means: No Identified Victim: NA History of harm to others?: No Assessment of Violence: None Noted Violent Behavior Description: Pt is calm and cooperative at this time Does patient have access to weapons?: No Criminal Charges Pending?: No Does patient have a court date: Yes Court Date: 12/18/13 (Completion of anger management classes.)  Abuse: Abuse/Neglect Assessment (Assessment to be complete while patient is alone) Physical Abuse: Denies Verbal Abuse: Denies Sexual Abuse: Denies Exploitation of patient/patient's resources: Denies Self-Neglect: Denies  Prior Inpatient Therapy: Prior Inpatient Therapy Prior Inpatient Therapy: Yes Prior Therapy Dates: 2010 Prior Therapy Facilty/Provider(s): [facility] Reason for Treatment: Detox   Prior Outpatient Therapy: Prior Outpatient Therapy Prior Outpatient Therapy: Yes Prior Therapy Dates: 2010  Prior Therapy Facilty/Provider(s): [place],  [place]  Reason for Treatment: SA  Additional Information: Additional Information 1:1 In Past 12 Months?: No CIRT Risk: No Elopement Risk: No Does patient have medical clearance?: Yes                  Objective: Blood pressure 102/68, pulse 102, temperature 97.9 F (36.6 C), temperature source Oral, resp. rate 16, height 5' 7"  (1.702 m), weight 172 lb 9.6 oz (78.291 kg), SpO2 99.00%.Body mass index is 27.03 kg/(m^2). Results for orders placed during the hospital encounter of 12/15/13 (from the past 72 hour(s))  CBC WITH DIFFERENTIAL     Status: Abnormal   Collection Time    12/15/13 10:24 PM      Result Value Ref Range   WBC 12.9 (*) 4.0 - 10.5 K/uL   RBC 5.24  4.22 - 5.81 MIL/uL   Hemoglobin 16.7  13.0 - 17.0 g/dL   HCT 46.7  39.0 - 52.0 %   MCV 89.1  78.0 - 100.0 fL   MCH 31.9  26.0 - 34.0 pg   MCHC 35.8  30.0 - 36.0 g/dL   RDW 13.2  11.5 - 15.5 %   Platelets 224  150 - 400 K/uL   Neutrophils Relative % 63  43 - 77 %   Lymphocytes Relative 26  12 - 46 %   Monocytes Relative 11  3 - 12 %   Eosinophils Relative 0  0 - 5 %   Basophils Relative 0  0 - 1 %   Neutro Abs 8.1 (*) 1.7 - 7.7 K/uL   Lymphs Abs 3.4  0.7 - 4.0 K/uL   Monocytes Absolute 1.4 (*) 0.1 - 1.0 K/uL   Eosinophils Absolute 0.0  0.0 - 0.7 K/uL   Basophils Absolute 0.0  0.0 - 0.1 K/uL   Smear Review MORPHOLOGY UNREMARKABLE    COMPREHENSIVE METABOLIC PANEL     Status: Abnormal   Collection Time    12/15/13 10:24 PM      Result Value Ref Range   Sodium 139  137 - 147 mEq/L   Potassium 3.8  3.7 - 5.3 mEq/L   Chloride 99  96 - 112 mEq/L   CO2 20  19 - 32 mEq/L   Glucose, Bld 101 (*) 70 - 99 mg/dL   BUN 24 (*) 6 - 23 mg/dL   Creatinine, Ser 1.20  0.50 - 1.35 mg/dL   Calcium 10.1  8.4 - 10.5 mg/dL   Total Protein 8.8 (*) 6.0 - 8.3 g/dL   Albumin 4.9  3.5 - 5.2 g/dL   AST 66 (*) 0 - 37 U/L   ALT 195 (*) 0 - 53 U/L   Alkaline Phosphatase 57  39 - 117 U/L   Total Bilirubin 0.7  0.3 - 1.2 mg/dL   GFR  calc non Af Amer 82 (*) >90 mL/min   GFR calc Af Amer >90  >90 mL/min   Comment: (NOTE)     The eGFR has been calculated using the CKD EPI equation.     This calculation has not been validated in all clinical situations.     eGFR's persistently <90 mL/min signify possible Chronic Kidney     Disease.   Anion gap 20 (*) 5 - 15  ETHANOL     Status: None   Collection Time    12/15/13 10:24 PM      Result Value Ref Range   Alcohol, Ethyl (B) <11  0 - 11 mg/dL  Comment:            LOWEST DETECTABLE LIMIT FOR     SERUM ALCOHOL IS 11 mg/dL     FOR MEDICAL PURPOSES ONLY  ACETAMINOPHEN LEVEL     Status: None   Collection Time    12/15/13 10:24 PM      Result Value Ref Range   Acetaminophen (Tylenol), Serum <15.0  10 - 30 ug/mL   Comment:            THERAPEUTIC CONCENTRATIONS VARY     SIGNIFICANTLY. A RANGE OF 10-30     ug/mL MAY BE AN EFFECTIVE     CONCENTRATION FOR MANY PATIENTS.     HOWEVER, SOME ARE BEST TREATED     AT CONCENTRATIONS OUTSIDE THIS     RANGE.     ACETAMINOPHEN CONCENTRATIONS     >150 ug/mL AT 4 HOURS AFTER     INGESTION AND >50 ug/mL AT 12     HOURS AFTER INGESTION ARE     OFTEN ASSOCIATED WITH TOXIC     REACTIONS.  SALICYLATE LEVEL     Status: Abnormal   Collection Time    12/15/13 10:24 PM      Result Value Ref Range   Salicylate Lvl <0.9 (*) 2.8 - 20.0 mg/dL  URINE RAPID DRUG SCREEN (HOSP PERFORMED)     Status: Abnormal   Collection Time    12/15/13 11:14 PM      Result Value Ref Range   Opiates NONE DETECTED  NONE DETECTED   Cocaine NONE DETECTED  NONE DETECTED   Benzodiazepines POSITIVE (*) NONE DETECTED   Amphetamines POSITIVE (*) NONE DETECTED   Tetrahydrocannabinol POSITIVE (*) NONE DETECTED   Barbiturates NONE DETECTED  NONE DETECTED   Comment:            DRUG SCREEN FOR MEDICAL PURPOSES     ONLY.  IF CONFIRMATION IS NEEDED     FOR ANY PURPOSE, NOTIFY LAB     WITHIN 5 DAYS.                LOWEST DETECTABLE LIMITS     FOR URINE DRUG SCREEN      Drug Class       Cutoff (ng/mL)     Amphetamine      1000     Barbiturate      200     Benzodiazepine   604     Tricyclics       540     Opiates          300     Cocaine          300     THC              50  I-STAT CHEM 8, ED     Status: Abnormal   Collection Time    12/16/13  5:56 AM      Result Value Ref Range   Sodium 138  137 - 147 mEq/L   Potassium 3.3 (*) 3.7 - 5.3 mEq/L   Chloride 103  96 - 112 mEq/L   BUN 28 (*) 6 - 23 mg/dL   Creatinine, Ser 1.20  0.50 - 1.35 mg/dL   Glucose, Bld 125 (*) 70 - 99 mg/dL   Calcium, Ion 1.14  1.12 - 1.23 mmol/L   TCO2 26  0 - 100 mmol/L   Hemoglobin 15.6  13.0 - 17.0 g/dL   HCT 46.0  39.0 - 52.0 %  Labs are reviewed and are pertinent for no medical issues noted except hypokalemia, treated..  Current Facility-Administered Medications  Medication Dose Route Frequency Provider Last Rate Last Dose  . acetaminophen (TYLENOL) tablet 650 mg  650 mg Oral Q4H PRN Antonietta Breach, PA-C      . alum & mag hydroxide-simeth (MAALOX/MYLANTA) 200-200-20 MG/5ML suspension 30 mL  30 mL Oral PRN Antonietta Breach, PA-C      . ibuprofen (ADVIL,MOTRIN) tablet 600 mg  600 mg Oral Q8H PRN Antonietta Breach, PA-C   600 mg at 12/16/13 0055  . LORazepam (ATIVAN) tablet 1 mg  1 mg Oral Q8H PRN Antonietta Breach, PA-C   1 mg at 12/15/13 2332  . nicotine (NICODERM CQ - dosed in mg/24 hours) patch 21 mg  21 mg Transdermal Daily Antonietta Breach, PA-C   21 mg at 12/16/13 0108  . ondansetron (ZOFRAN) tablet 4 mg  4 mg Oral Q8H PRN Antonietta Breach, PA-C      . potassium chloride SA (K-DUR,KLOR-CON) CR tablet 40 mEq  40 mEq Oral Once Waylan Boga, NP      . zolpidem (AMBIEN) tablet 5 mg  5 mg Oral QHS PRN Antonietta Breach, PA-C   5 mg at 12/16/13 0055   No current outpatient prescriptions on file.    Psychiatric Specialty Exam:     Blood pressure 102/68, pulse 102, temperature 97.9 F (36.6 C), temperature source Oral, resp. rate 16, height 5' 7"  (1.702 m), weight 172 lb 9.6 oz (78.291 kg), SpO2  99.00%.Body mass index is 27.03 kg/(m^2).  General Appearance: Casual  Eye Contact::  Good  Speech:  Normal Rate  Volume:  Normal  Mood:  Euthymic  Affect:  Congruent  Thought Process:  Coherent  Orientation:  Full (Time, Place, and Person)  Thought Content:  WDL  Suicidal Thoughts:  No  Homicidal Thoughts:  No  Memory:  Immediate;   Good Recent;   Good Remote;   Good  Judgement:  Fair  Insight:  Fair  Psychomotor Activity:  Normal  Concentration:  Good  Recall:  Good  Fund of Knowledge:Good  Language: Good  Akathisia:  No  Handed:  Right  AIMS (if indicated):     Assets:  Communication Skills Desire for [place] Talents/Skills Transportation Vocational/Educational  Sleep:      Musculoskeletal: Strength & Muscle Tone: within normal limits Gait & Station: normal Patient leans: N/A  Treatment Plan Summary: Discharge home with follow-up rehab resources given.  Waylan Boga, [place] 12/16/2013 11:06 AM  Patient seen, evaluated and I agree with notes by Nurse Practitioner. Corena Pilgrim, MD
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
## 2013-12-16 NOTE — ED Notes (Signed)
Bed: ZO10 Expected date:  Expected time:  Means of arrival:  Comments: Tr3

## 2019-09-18 ENCOUNTER — Inpatient Hospital Stay (HOSPITAL_COMMUNITY)
Admission: EM | Admit: 2019-09-18 | Discharge: 2019-10-12 | DRG: 028 | Disposition: A | Discharge status: Home / Self Care | Payer: Medicaid Other | Source: Other Acute Inpatient Hospital | Attending: Internal Medicine | Admitting: Internal Medicine

## 2019-09-18 ENCOUNTER — Encounter (HOSPITAL_COMMUNITY): Payer: Self-pay | Admitting: Internal Medicine

## 2019-09-18 ENCOUNTER — Other Ambulatory Visit: Payer: Self-pay

## 2019-09-18 DIAGNOSIS — I82221 Chronic embolism and thrombosis of inferior vena cava: Secondary | ICD-10-CM | POA: Diagnosis not present

## 2019-09-18 DIAGNOSIS — M6008 Infective myositis, other site: Secondary | ICD-10-CM | POA: Diagnosis present

## 2019-09-18 DIAGNOSIS — B372 Candidiasis of skin and nail: Secondary | ICD-10-CM | POA: Diagnosis present

## 2019-09-18 DIAGNOSIS — I330 Acute and subacute infective endocarditis: Secondary | ICD-10-CM | POA: Diagnosis present

## 2019-09-18 DIAGNOSIS — M545 Low back pain: Secondary | ICD-10-CM | POA: Diagnosis not present

## 2019-09-18 DIAGNOSIS — Z79891 Long term (current) use of opiate analgesic: Secondary | ICD-10-CM

## 2019-09-18 DIAGNOSIS — L271 Localized skin eruption due to drugs and medicaments taken internally: Secondary | ICD-10-CM | POA: Diagnosis not present

## 2019-09-18 DIAGNOSIS — L03319 Cellulitis of trunk, unspecified: Secondary | ICD-10-CM

## 2019-09-18 DIAGNOSIS — L03312 Cellulitis of back [any part except buttock]: Secondary | ICD-10-CM | POA: Diagnosis present

## 2019-09-18 DIAGNOSIS — R042 Hemoptysis: Secondary | ICD-10-CM | POA: Diagnosis present

## 2019-09-18 DIAGNOSIS — I2690 Septic pulmonary embolism without acute cor pulmonale: Secondary | ICD-10-CM | POA: Diagnosis present

## 2019-09-18 DIAGNOSIS — B182 Chronic viral hepatitis C: Secondary | ICD-10-CM | POA: Diagnosis present

## 2019-09-18 DIAGNOSIS — R05 Cough: Secondary | ICD-10-CM | POA: Diagnosis not present

## 2019-09-18 DIAGNOSIS — T368X5A Adverse effect of other systemic antibiotics, initial encounter: Secondary | ICD-10-CM | POA: Diagnosis not present

## 2019-09-18 DIAGNOSIS — E872 Acidosis: Secondary | ICD-10-CM | POA: Diagnosis present

## 2019-09-18 DIAGNOSIS — I76 Septic arterial embolism: Secondary | ICD-10-CM | POA: Diagnosis present

## 2019-09-18 DIAGNOSIS — I82220 Acute embolism and thrombosis of inferior vena cava: Secondary | ICD-10-CM | POA: Diagnosis present

## 2019-09-18 DIAGNOSIS — J90 Pleural effusion, not elsewhere classified: Secondary | ICD-10-CM | POA: Diagnosis present

## 2019-09-18 DIAGNOSIS — M4646 Discitis, unspecified, lumbar region: Secondary | ICD-10-CM | POA: Diagnosis not present

## 2019-09-18 DIAGNOSIS — M6289 Other specified disorders of muscle: Secondary | ICD-10-CM | POA: Diagnosis present

## 2019-09-18 DIAGNOSIS — R7881 Bacteremia: Secondary | ICD-10-CM | POA: Diagnosis not present

## 2019-09-18 DIAGNOSIS — L304 Erythema intertrigo: Secondary | ICD-10-CM | POA: Diagnosis present

## 2019-09-18 DIAGNOSIS — M48062 Spinal stenosis, lumbar region with neurogenic claudication: Secondary | ICD-10-CM | POA: Diagnosis present

## 2019-09-18 DIAGNOSIS — R109 Unspecified abdominal pain: Secondary | ICD-10-CM | POA: Diagnosis not present

## 2019-09-18 DIAGNOSIS — G061 Intraspinal abscess and granuloma: Principal | ICD-10-CM | POA: Diagnosis present

## 2019-09-18 DIAGNOSIS — J189 Pneumonia, unspecified organism: Secondary | ICD-10-CM | POA: Diagnosis present

## 2019-09-18 DIAGNOSIS — F1190 Opioid use, unspecified, uncomplicated: Secondary | ICD-10-CM | POA: Diagnosis not present

## 2019-09-18 DIAGNOSIS — A419 Sepsis, unspecified organism: Secondary | ICD-10-CM

## 2019-09-18 DIAGNOSIS — A4102 Sepsis due to Methicillin resistant Staphylococcus aureus: Secondary | ICD-10-CM | POA: Diagnosis present

## 2019-09-18 DIAGNOSIS — E876 Hypokalemia: Secondary | ICD-10-CM | POA: Diagnosis present

## 2019-09-18 DIAGNOSIS — M4636 Infection of intervertebral disc (pyogenic), lumbar region: Secondary | ICD-10-CM | POA: Diagnosis present

## 2019-09-18 DIAGNOSIS — I079 Rheumatic tricuspid valve disease, unspecified: Secondary | ICD-10-CM

## 2019-09-18 DIAGNOSIS — Z419 Encounter for procedure for purposes other than remedying health state, unspecified: Secondary | ICD-10-CM

## 2019-09-18 DIAGNOSIS — G7281 Critical illness myopathy: Secondary | ICD-10-CM | POA: Diagnosis not present

## 2019-09-18 DIAGNOSIS — M549 Dorsalgia, unspecified: Secondary | ICD-10-CM | POA: Diagnosis not present

## 2019-09-18 DIAGNOSIS — Z9889 Other specified postprocedural states: Secondary | ICD-10-CM | POA: Diagnosis not present

## 2019-09-18 DIAGNOSIS — I368 Other nonrheumatic tricuspid valve disorders: Secondary | ICD-10-CM | POA: Diagnosis not present

## 2019-09-18 DIAGNOSIS — G894 Chronic pain syndrome: Secondary | ICD-10-CM | POA: Diagnosis present

## 2019-09-18 DIAGNOSIS — A021 Salmonella sepsis: Secondary | ICD-10-CM | POA: Diagnosis not present

## 2019-09-18 DIAGNOSIS — E871 Hypo-osmolality and hyponatremia: Secondary | ICD-10-CM | POA: Diagnosis present

## 2019-09-18 DIAGNOSIS — R21 Rash and other nonspecific skin eruption: Secondary | ICD-10-CM | POA: Diagnosis not present

## 2019-09-18 DIAGNOSIS — I339 Acute and subacute endocarditis, unspecified: Secondary | ICD-10-CM | POA: Diagnosis not present

## 2019-09-18 DIAGNOSIS — B9562 Methicillin resistant Staphylococcus aureus infection as the cause of diseases classified elsewhere: Secondary | ICD-10-CM | POA: Diagnosis not present

## 2019-09-18 DIAGNOSIS — I360 Nonrheumatic tricuspid (valve) stenosis: Secondary | ICD-10-CM | POA: Diagnosis not present

## 2019-09-18 DIAGNOSIS — B1710 Acute hepatitis C without hepatic coma: Secondary | ICD-10-CM | POA: Diagnosis not present

## 2019-09-18 DIAGNOSIS — F17290 Nicotine dependence, other tobacco product, uncomplicated: Secondary | ICD-10-CM | POA: Diagnosis present

## 2019-09-18 DIAGNOSIS — M7989 Other specified soft tissue disorders: Secondary | ICD-10-CM | POA: Diagnosis not present

## 2019-09-18 DIAGNOSIS — R6520 Severe sepsis without septic shock: Secondary | ICD-10-CM | POA: Diagnosis not present

## 2019-09-18 HISTORY — DX: Other chronic pain: G89.29

## 2019-09-18 MED ORDER — ACETAMINOPHEN 650 MG RE SUPP: 650.0000 mg | Freq: Four times a day (QID) | RECTAL | Status: DC | PRN

## 2019-09-18 MED ORDER — KETOROLAC TROMETHAMINE 30 MG/ML IJ SOLN
30.0000 mg | Freq: Four times a day (QID) | INTRAMUSCULAR | Status: DC | PRN
  Administered 2019-09-19 – 2019-09-22 (×6): 30 mg via INTRAVENOUS
  Filled 2019-09-18 (×6): qty 1

## 2019-09-18 MED ORDER — SODIUM CHLORIDE 0.9 % IV SOLN: INTRAVENOUS | Status: DC

## 2019-09-18 MED ORDER — ONDANSETRON HCL 4 MG/2ML IJ SOLN: 4.0000 mg | Freq: Four times a day (QID) | INTRAMUSCULAR | Status: DC | PRN

## 2019-09-18 MED ORDER — MORPHINE SULFATE (PF) 2 MG/ML IV SOLN
2.0000 mg | INTRAVENOUS | Status: DC | PRN
  Administered 2019-09-18 – 2019-09-22 (×15): 2 mg via INTRAVENOUS
  Filled 2019-09-18 (×18): qty 1

## 2019-09-18 MED ORDER — ONDANSETRON HCL 4 MG PO TABS: 4.0000 mg | ORAL_TABLET | Freq: Four times a day (QID) | ORAL | Status: DC | PRN

## 2019-09-18 MED ORDER — ACETAMINOPHEN 325 MG PO TABS
650.0000 mg | ORAL_TABLET | Freq: Four times a day (QID) | ORAL | Status: DC | PRN
  Administered 2019-10-02 – 2019-10-11 (×5): 650 mg via ORAL
  Filled 2019-09-18 (×5): qty 2

## 2019-09-18 MED ORDER — HYDROCODONE-ACETAMINOPHEN 5-325 MG PO TABS
1.0000 | ORAL_TABLET | ORAL | Status: DC | PRN
  Administered 2019-09-19 – 2019-09-21 (×15): 2 via ORAL
  Administered 2019-09-21: 1 via ORAL
  Administered 2019-09-22 – 2019-09-28 (×35): 2 via ORAL
  Filled 2019-09-18 (×51): qty 2
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
## 2019-09-19 ENCOUNTER — Inpatient Hospital Stay (HOSPITAL_COMMUNITY): Payer: Medicaid Other

## 2019-09-19 DIAGNOSIS — I82220 Acute embolism and thrombosis of inferior vena cava: Secondary | ICD-10-CM

## 2019-09-19 DIAGNOSIS — R7881 Bacteremia: Secondary | ICD-10-CM

## 2019-09-19 DIAGNOSIS — B9562 Methicillin resistant Staphylococcus aureus infection as the cause of diseases classified elsewhere: Secondary | ICD-10-CM | POA: Insufficient documentation

## 2019-09-19 DIAGNOSIS — F1190 Opioid use, unspecified, uncomplicated: Secondary | ICD-10-CM

## 2019-09-19 DIAGNOSIS — I76 Septic arterial embolism: Secondary | ICD-10-CM

## 2019-09-19 DIAGNOSIS — L03319 Cellulitis of trunk, unspecified: Secondary | ICD-10-CM

## 2019-09-19 LAB — HEPATITIS C ANTIBODY: HCV Ab: REACTIVE — AB

## 2019-09-19 LAB — COMPREHENSIVE METABOLIC PANEL
ALT: 39 U/L (ref 0–44)
AST: 32 U/L (ref 15–41)
Albumin: 2.5 g/dL — ABNORMAL LOW (ref 3.5–5.0)
Alkaline Phosphatase: 79 U/L (ref 38–126)
Anion gap: 5 (ref 5–15)
BUN: 10 mg/dL (ref 6–20)
CO2: 19 mmol/L — ABNORMAL LOW (ref 22–32)
Calcium: 8.3 mg/dL — ABNORMAL LOW (ref 8.9–10.3)
Chloride: 113 mmol/L — ABNORMAL HIGH (ref 98–111)
Creatinine, Ser: 0.62 mg/dL (ref 0.61–1.24)
GFR calc Af Amer: >60 mL/min (ref >60)
GFR calc non Af Amer: >60 mL/min (ref >60)
Glucose, Bld: 88 mg/dL (ref 70–99)
Potassium: 4.1 mmol/L (ref 3.5–5.1)
Sodium: 137 mmol/L (ref 135–145)
Total Bilirubin: 0.7 mg/dL (ref 0.3–1.2)
Total Protein: 4.5 g/dL — ABNORMAL LOW (ref 6.5–8.1)

## 2019-09-19 LAB — CBC WITH DIFFERENTIAL/PLATELET
Abs Immature Granulocytes: 0.19 10*3/uL — ABNORMAL HIGH (ref 0.00–0.07)
Basophils Absolute: 0 10*3/uL (ref 0.0–0.1)
Basophils Relative: 0 %
Eosinophils Absolute: 0 10*3/uL (ref 0.0–0.5)
Eosinophils Relative: 0 %
HCT: 34.6 % — ABNORMAL LOW (ref 39.0–52.0)
Hemoglobin: 12.1 g/dL — ABNORMAL LOW (ref 13.0–17.0)
Immature Granulocytes: 1 %
Lymphocytes Relative: 5 %
Lymphs Abs: 1 10*3/uL (ref 0.7–4.0)
MCH: 29.9 pg (ref 26.0–34.0)
MCHC: 35 g/dL (ref 30.0–36.0)
MCV: 85.4 fL (ref 80.0–100.0)
Monocytes Absolute: 1.4 10*3/uL — ABNORMAL HIGH (ref 0.1–1.0)
Monocytes Relative: 8 %
Neutro Abs: 15.9 10*3/uL — ABNORMAL HIGH (ref 1.7–7.7)
Neutrophils Relative %: 86 %
Platelets: 184 10*3/uL (ref 150–400)
RBC: 4.05 MIL/uL — ABNORMAL LOW (ref 4.22–5.81)
RDW: 12.4 % (ref 11.5–15.5)
WBC: 18.5 10*3/uL — ABNORMAL HIGH (ref 4.0–10.5)
nRBC: 0 % (ref 0.0–0.2)

## 2019-09-19 LAB — LACTIC ACID, PLASMA: Lactic Acid, Venous: 1.2 mmol/L (ref 0.5–1.9)

## 2019-09-19 LAB — ECHOCARDIOGRAM COMPLETE
Height: 67 in
Weight: 2881.85 oz

## 2019-09-19 LAB — HIV ANTIBODY (ROUTINE TESTING W REFLEX): HIV Screen 4th Generation wRfx: NONREACTIVE

## 2019-09-19 LAB — HEPARIN LEVEL (UNFRACTIONATED)
Heparin Unfractionated: <0.1 IU/mL — ABNORMAL LOW (ref 0.30–0.70)
Heparin Unfractionated: <0.1 IU/mL — ABNORMAL LOW (ref 0.30–0.70)
Heparin Unfractionated: 0.17 IU/mL — ABNORMAL LOW (ref 0.30–0.70)

## 2019-09-19 LAB — C-REACTIVE PROTEIN: CRP: 19.2 mg/dL — ABNORMAL HIGH (ref <1.0)

## 2019-09-19 LAB — SEDIMENTATION RATE: Sed Rate: 58 mm/hr — ABNORMAL HIGH (ref 0–16)

## 2019-09-19 LAB — PROTIME-INR
INR: 1.2 (ref 0.8–1.2)
Prothrombin Time: 14.3 seconds (ref 11.4–15.2)

## 2019-09-19 LAB — APTT: aPTT: 40 seconds — ABNORMAL HIGH (ref 24–36)

## 2019-09-19 IMAGING — CT CT CHEST W/O CM
2 of 4 series · 15 of 36 positions shown, 18 images · non-contrast
Comparison: None.

CLINICAL DATA: Sepsis.

EXAM:
CT CHEST WITHOUT CONTRAST
TECHNIQUE: Multidetector CT imaging of the chest was performed following the
standard protocol without IV contrast.

[Series 4: thorax 2.0 · axial · 0.75mm/px · z∈[+1034,+1322]mm · 12 of 162 slices shown, 15 images]
[im 9/162  mediastinal]
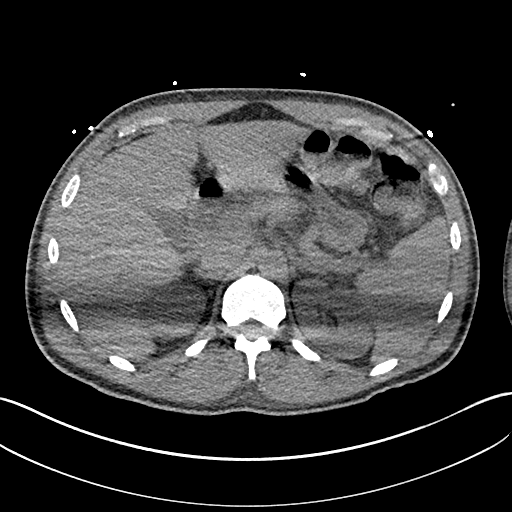
[im 9/162  lung]
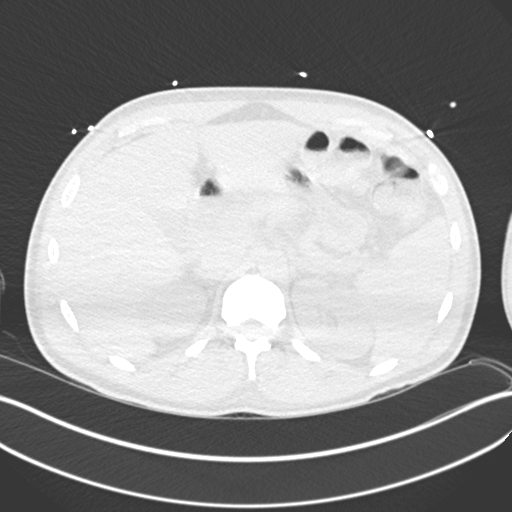
[im 26/162  lung]
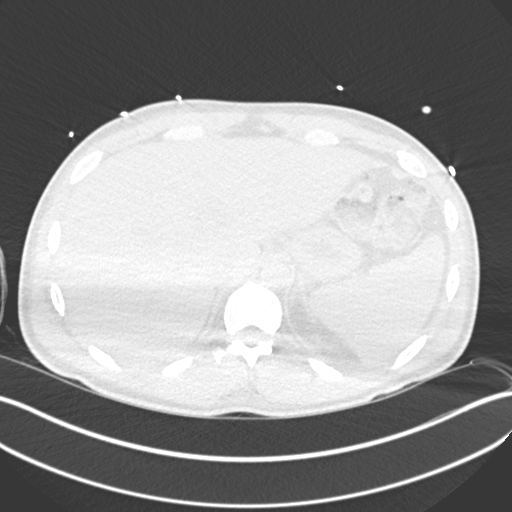
[im 34/162  lung]
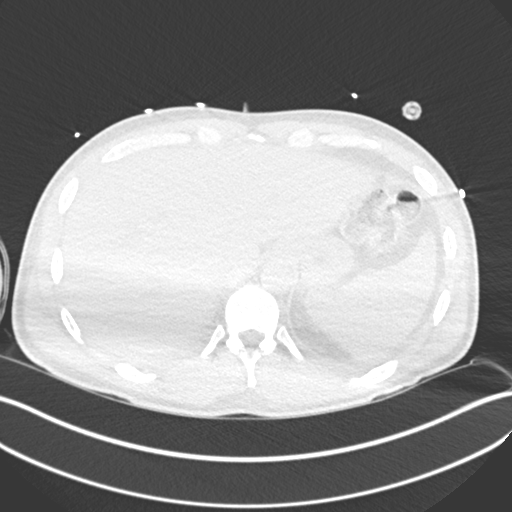
[im 51/162  lung]
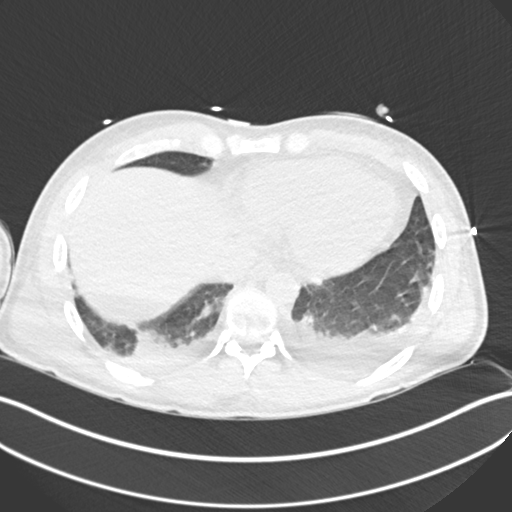
[im 60/162  mediastinal]
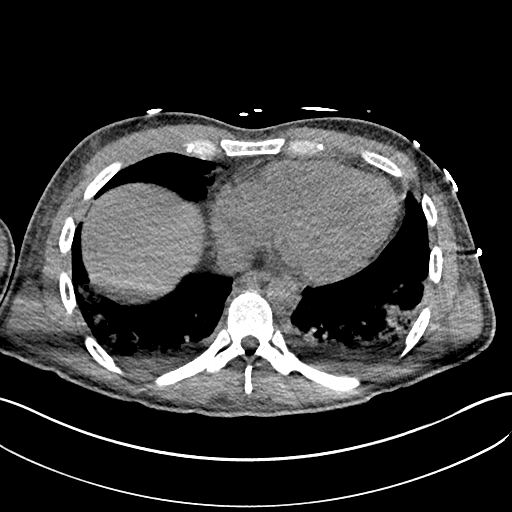
[im 60/162  lung]
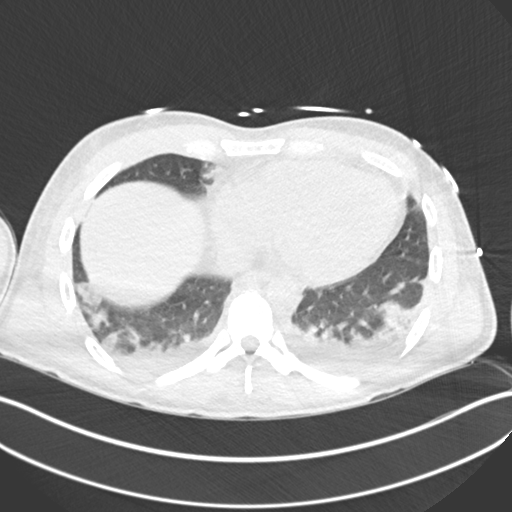
[im 77/162  lung]
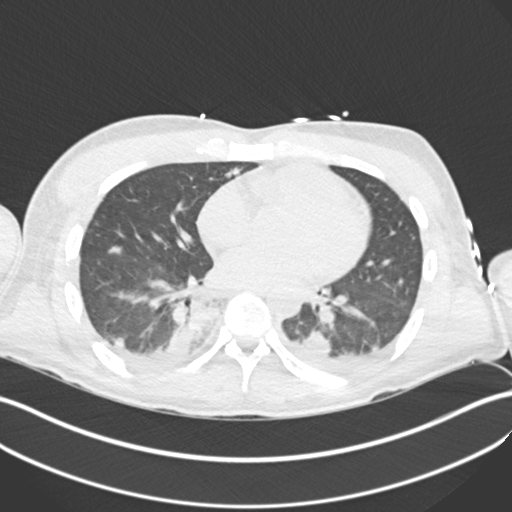
[im 85/162  lung]
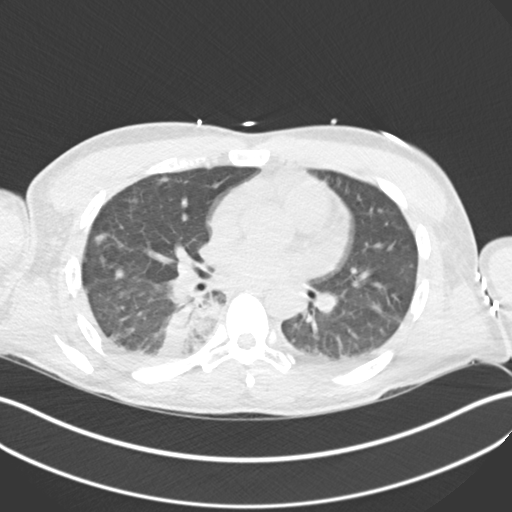
[im 102/162  lung]
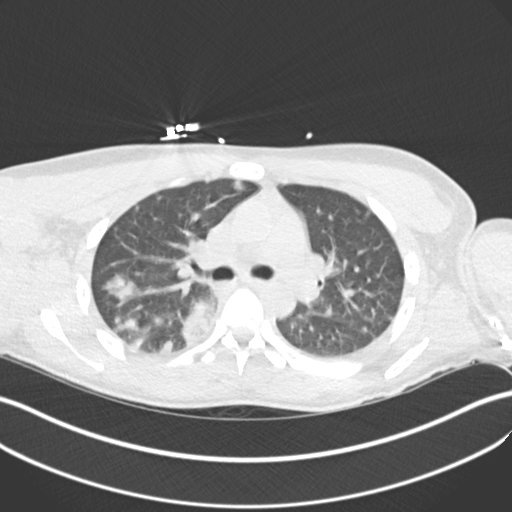
[im 111/162  mediastinal]
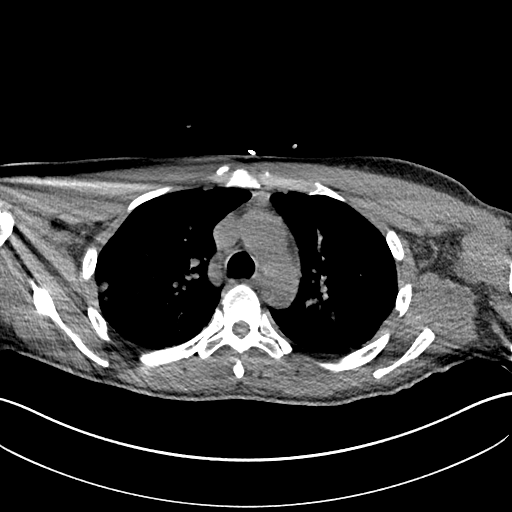
[im 111/162  lung]
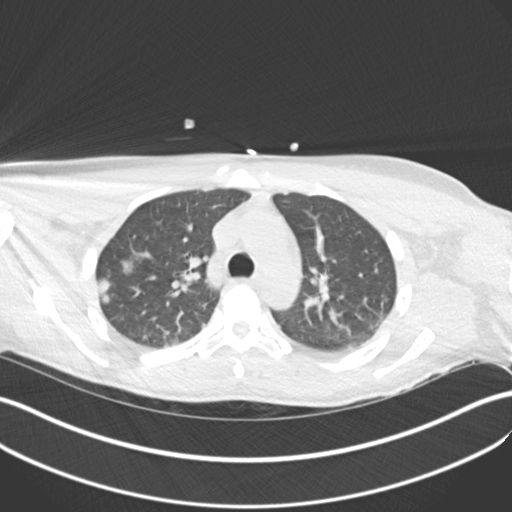
[im 128/162  lung]
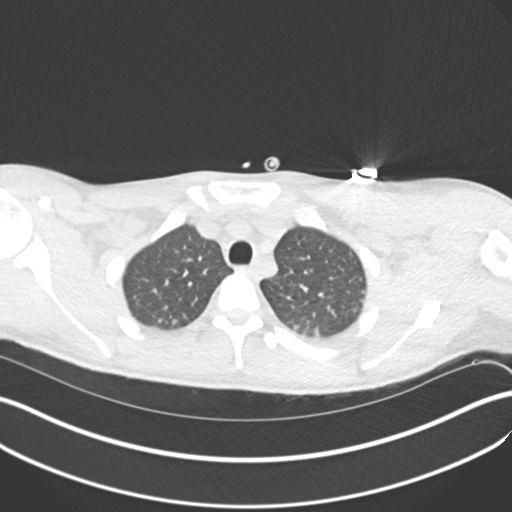
[im 136/162  lung]
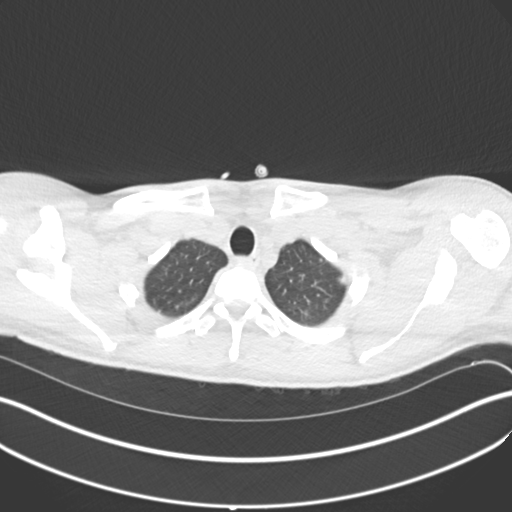
[im 153/162  lung]
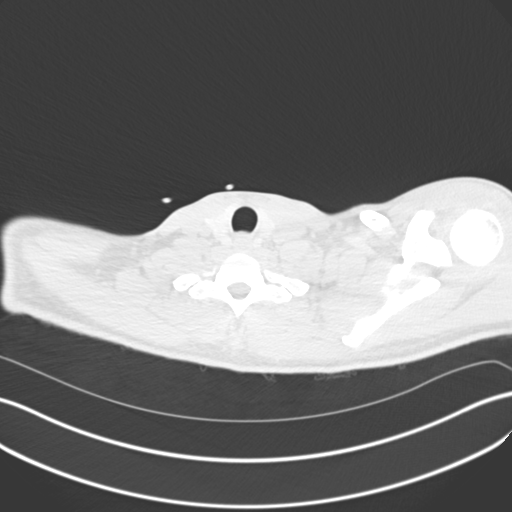

[Series 7: coronal · coronal · 0.60mm/px · 3 of 101 slices shown]
[im 21/101  lung]
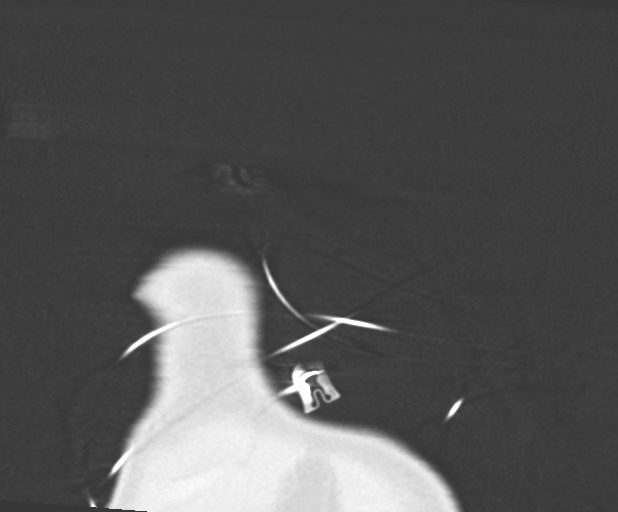
[im 41/101  lung]
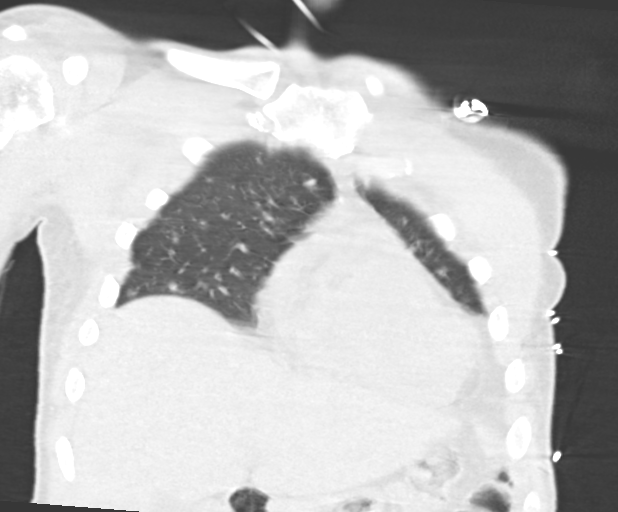
[im 61/101  lung]
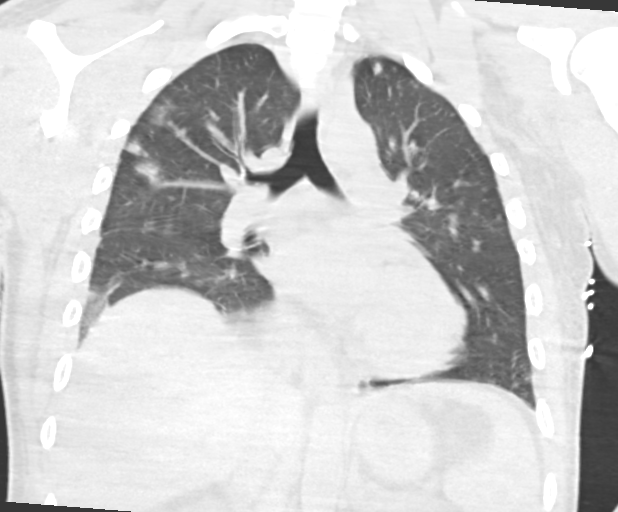

[15 of 36 positions shown; findings below may reference images not displayed]

FINDINGS: Cardiovascular: No significant vascular findings. Normal heart size.
No pericardial effusion.

Mediastinum/Nodes: No enlarged mediastinal or axillary lymph nodes.
Thyroid gland, trachea, and esophagus demonstrate no significant
findings.

Lungs/Pleura: No pneumothorax is noted. Small bilateral pleural
effusions are noted. Multiple rounded but ill-defined airspace
opacities are noted throughout both lungs concerning for septic
emboli. Large right lower lobe airspace opacity is noted most
consistent with pneumonia. Left posterior basilar subsegmental
atelectasis is noted.

Upper Abdomen: No acute abnormality.

Musculoskeletal: No chest wall mass or suspicious bone lesions
identified.
IMPRESSION: Multiple rounded but ill-defined airspace opacities are noted
throughout both lungs concerning for septic emboli. Large right
lower lobe airspace opacity is noted most consistent with pneumonia.
Small bilateral pleural effusions are noted.

## 2019-09-19 MED ORDER — PIPERACILLIN-TAZOBACTAM 3.375 G IVPB
3.3750 g | Freq: Three times a day (TID) | INTRAVENOUS | Status: DC
  Administered 2019-09-19: 3.375 g via INTRAVENOUS
  Filled 2019-09-19: qty 50

## 2019-09-19 MED ORDER — HEPARIN BOLUS VIA INFUSION
2500.0000 [IU] | Freq: Once | INTRAVENOUS | Status: AC
  Administered 2019-09-19: 2500 [IU] via INTRAVENOUS
  Filled 2019-09-19: qty 2500

## 2019-09-19 MED ORDER — HEPARIN (PORCINE) 25000 UT/250ML-% IV SOLN
2700.0000 [IU]/h | INTRAVENOUS | Status: AC
  Administered 2019-09-19: 1600 [IU]/h via INTRAVENOUS
  Administered 2019-09-19: 1900 [IU]/h via INTRAVENOUS
  Administered 2019-09-20 – 2019-09-21 (×2): 2700 [IU]/h via INTRAVENOUS
  Filled 2019-09-19 (×5): qty 250

## 2019-09-19 MED ORDER — HEPARIN BOLUS VIA INFUSION
4000.0000 [IU] | Freq: Once | INTRAVENOUS | Status: AC
  Administered 2019-09-19: 4000 [IU] via INTRAVENOUS
  Filled 2019-09-19: qty 4000

## 2019-09-19 MED ORDER — VANCOMYCIN HCL IN DEXTROSE 1-5 GM/200ML-% IV SOLN
1000.0000 mg | Freq: Three times a day (TID) | INTRAVENOUS | Status: DC
  Administered 2019-09-19 – 2019-10-03 (×41): 1000 mg via INTRAVENOUS
  Filled 2019-09-19 (×44): qty 200

## 2019-09-19 MED ORDER — HEPARIN BOLUS VIA INFUSION
2000.0000 [IU] | Freq: Once | INTRAVENOUS | Status: AC
  Administered 2019-09-19: 2000 [IU] via INTRAVENOUS
  Filled 2019-09-19: qty 2000

## 2019-09-19 NOTE — Progress Notes (Signed)
Pharmacy Antibiotic Note  Scott Green is a 34 y.o. male admitted on 09/18/2019 with bacteremia, sepsis, ?right paraspinal muscle abscesses, ?septic emboli.  Pharmacy has been consulted for Vancomycin/Zosyn dosing. Scr at [place] between 0.7-0.9.  Pt is a transfer from [facility]   Vancomycin 6/29>> Zosyn 6/29>>  Blood culture at [facility]: MRSA  Plan: Vancomycin 1000 mg IV q8h Zosyn 3.375G IV q8h to be infused over 4 hours Trend WBC, temp, renal function  F/U infectious work-up Drug levels as indicated   Height: 5\' 7"  (170.2 cm) Weight: 81.7 kg (180 lb 1.9 oz) IBW/kg (Calculated) : 66.1  Temp (24hrs), Avg:100.7 F (38.2 C), Min:100.7 F (38.2 C), Max:100.7 F (38.2 C)   Allergies  Allergen Reactions  . Tramadol Other (See Comments)    Stomach pain    , PharmD, BCPS Clinical Pharmacist Phone: [phone]

## 2019-09-19 NOTE — Plan of Care (Signed)
Continue to monitor

## 2019-09-19 NOTE — Progress Notes (Signed)
ANTICOAGULATION CONSULT NOTE - Initial Consult  Pharmacy Consult for Heparin  Indication: ?Non-occlusive infra-renal IVC thrombus  Allergies  Allergen Reactions  . Tramadol Other (See Comments)    Stomach pain    Patient Measurements: Height: 5\' 7"  (170.2 cm) Weight: 81.7 kg (180 lb 1.9 oz) IBW/kg (Calculated) : 66.1  Vital Signs: Temp: 99.4 F (37.4 C) (07/01 0200) Temp Source: Oral (07/01 0200) BP: 126/87 (07/01 0300)   Medical History: Past Medical History:  Diagnosis Date  . Chronic back pain     Assessment: 34 y/o M transfer from [facility]. Pt presented with low back pain. Found to have MRSA bacteremia with ?paraspinal muscle abscesses. Incidental finding on CT of ?infrarenal IVC thrombus. Pt is transferred from [place] on 1600 units/hr of heparin.   7/1 AM update: Heparin level undetectable No issues per RN  Goal of Therapy:  Heparin level 0.3-0.7 units/ml Monitor platelets by anticoagulation protocol: Yes   Plan:  -Heparin 2000 units BOLUS -Inc heparin to 1900 units/hr -1200 heparin level  9/1, PharmD, BCPS Clinical Pharmacist Phone: [phone]

## 2019-09-19 NOTE — Progress Notes (Signed)
ANTICOAGULATION CONSULT NOTE - Follow Up Consult  Pharmacy Consult for Heparin Indication: concern for non-occlusive IVC thrombus  Allergies  Allergen Reactions  . Tramadol Other (See Comments)    Stomach pain    Patient Measurements: Height: 5\' 7"  (170.2 cm) Weight: 81.7 kg (180 lb 1.9 oz) IBW/kg (Calculated) : 66.1 Heparin Dosing Weight: 81.7 kg  Vital Signs: Temp: 98.4 F (36.9 C) (07/01 1149) Temp Source: Oral (07/01 1149) BP: 127/76 (07/01 1149) Pulse Rate: 96 (07/01 1149)  Labs: Recent Labs    09/19/19 0239 09/19/19 1148  HGB 12.1*  --   HCT 34.6*  --   PLT 184  --   APTT 40*  --   LABPROT 14.3  --   INR 1.2  --   HEPARINUNFRC <0.10* <0.10*  CREATININE 0.62  --     Estimated Creatinine Clearance: 134.3 mL/min (by C-G formula based on SCr of 0.62 mg/dL).  Assessment:  34 y/o M transfer from [facility]. Pt presented with low back pain. Found to have MRSA bacteremia with ?paraspinal muscle abscesses. Incidental finding on CT of ?infrarenal IVC thrombus. Also with possible septic pulmonary emboli.      Transferred from [place] on 1600 units/hr IV heparin and initial heparin level was undetectable.      Heparin level remains undetectable (< 0.1) after 2000 unit bolus and rate increased to 1900 units/hr ~4am.  No known infusion problems.   Goal of Therapy:  Heparin level 0.3-0.7 units/ml Monitor platelets by anticoagulation protocol: Yes   Plan:   Off the floor at Radiology  Upon return, heparin 4000 units IV bolus  Increase heparin drip to 2200 units/hr  Heparin level ~6 hrs after dosing adjustment  Daily heparin level and CBC.   [place], RPh Phone: [phone] 09/19/2019,1:35 PM

## 2019-09-19 NOTE — Progress Notes (Signed)
2nd attempt to scan pt. 1st attempt, pt said he had a migraine and attempt at a later time. 2nd attempt, pt refusing and to try tomorrow 7/2. Nurse tried to encourage pt to come but he still refused.

## 2019-09-19 NOTE — Plan of Care (Signed)
°  Problem: Fluid Volume: °Goal: Hemodynamic stability will improve °Outcome: Not Progressing °  °Problem: Clinical Measurements: °Goal: Diagnostic test results will improve °Outcome: Not Progressing °Goal: Signs and symptoms of infection will decrease °Outcome: Not Progressing °  °Problem: Respiratory: °Goal: Ability to maintain adequate ventilation will improve °Outcome: Not Progressing °  °

## 2019-09-19 NOTE — Progress Notes (Signed)
MRI called back to see if patient was ready to go pt was asleep when I woke him up he said his pain was better but he wasn't going right now, again explained to patient the importance of this procedure pt continues to refuse at this time.

## 2019-09-19 NOTE — Progress Notes (Signed)
Transport here to take patient for MRI of his spine, pt refuses say he has a migraine although he has been medicated with Vicodin and morphine. Explained the importance of his MRI to his care he said come back in 2 hours. I explained to the patient they may be busy with urgent cases and this could delay his care. MRI will call back in a couple of hours

## 2019-09-19 NOTE — Progress Notes (Signed)
ANTICOAGULATION CONSULT NOTE - Initial Consult  Pharmacy Consult for Heparin  Indication: ?Non-occlusive infra-renal IVC thrombus  Allergies  Allergen Reactions  . Tramadol Other (See Comments)    Stomach pain    Patient Measurements: Height: 5\' 7"  (170.2 cm) Weight: 81.7 kg (180 lb 1.9 oz) IBW/kg (Calculated) : 66.1  Vital Signs: Temp: 100.7 F (38.2 C) (06/30 2300) Temp Source: Oral (06/30 2300) BP: 137/87 (06/30 2300)   Medical History: Past Medical History:  Diagnosis Date  . Chronic back pain     Assessment: 34 y/o M transfer from [facility]. Pt presented with low back pain. Found to have MRSA bacteremia with ?paraspinal muscle abscesses. Incidental finding on CT of ?infrarenal IVC thrombus. Pt is transferred from [place] on 1600 units/hr of heparin.   Goal of Therapy:  Heparin level 0.3-0.7 units/ml Monitor platelets by anticoagulation protocol: Yes   Plan:  -Cont heparin at 1600 units/hr -Check heparin level with AM labs  [place], PharmD, BCPS Clinical Pharmacist Phone: [phone]
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
## 2019-09-19 NOTE — Progress Notes (Signed)
°  Echocardiogram 2D Echocardiogram has been performed.  Tye Savoy 09/19/2019, 1:57 PM

## 2019-09-19 NOTE — Progress Notes (Signed)
ANTICOAGULATION CONSULT NOTE - Follow Up Consult  Pharmacy Consult for Heparin Indication: concern for non-occlusive IVC thrombus  Allergies  Allergen Reactions  . Tramadol Other (See Comments)    Stomach pain    Patient Measurements: Height: 5\' 7"  (170.2 cm) Weight: 81.7 kg (180 lb 1.9 oz) IBW/kg (Calculated) : 66.1 Heparin Dosing Weight: 81.7 kg  Vital Signs: Temp: 98.3 F (36.8 C) (07/01 2000) Temp Source: Oral (07/01 2000) BP: 116/80 (07/01 2000) Pulse Rate: 91 (07/01 2000)  Labs: Recent Labs    09/19/19 0239 09/19/19 1148 09/19/19 2051  HGB 12.1*  --   --   HCT 34.6*  --   --   PLT 184  --   --   APTT 40*  --   --   LABPROT 14.3  --   --   INR 1.2  --   --   HEPARINUNFRC <0.10* <0.10* 0.17*  CREATININE 0.62  --   --     Estimated Creatinine Clearance: 134.3 mL/min (by C-G formula based on SCr of 0.62 mg/dL).  Assessment:  34 y/o M transfer from [facility]. Pt presented with low back pain. Found to have MRSA bacteremia with ?paraspinal muscle abscesses. Incidental finding on CT of ?infrarenal IVC thrombus. Also with possible septic pulmonary emboli.    Heparin level remains low at 0.17 but now trending up.   Goal of Therapy:  Heparin level 0.3-0.7 units/ml Monitor platelets by anticoagulation protocol: Yes   Plan:  -Heparin 2500 units x1 then increase to 2450 units/h -Recheck heparin level in 6h   [facility], PharmD, BCPS Clinical Pharmacist [phone] Please check AMION for all [facility] Pharmacy numbers 09/19/2019

## 2019-09-20 ENCOUNTER — Inpatient Hospital Stay (HOSPITAL_COMMUNITY): Payer: Medicaid Other

## 2019-09-20 LAB — BASIC METABOLIC PANEL
Anion gap: 8 (ref 5–15)
BUN: 16 mg/dL (ref 6–20)
CO2: 23 mmol/L (ref 22–32)
Calcium: 7.3 mg/dL — ABNORMAL LOW (ref 8.9–10.3)
Chloride: 101 mmol/L (ref 98–111)
Creatinine, Ser: 1.23 mg/dL (ref 0.61–1.24)
GFR calc Af Amer: >60 mL/min (ref >60)
GFR calc non Af Amer: >60 mL/min (ref >60)
Glucose, Bld: 114 mg/dL — ABNORMAL HIGH (ref 70–99)
Potassium: 2.8 mmol/L — ABNORMAL LOW (ref 3.5–5.1)
Sodium: 132 mmol/L — ABNORMAL LOW (ref 135–145)

## 2019-09-20 LAB — CBC
HCT: 33.7 % — ABNORMAL LOW (ref 39.0–52.0)
Hemoglobin: 11.7 g/dL — ABNORMAL LOW (ref 13.0–17.0)
MCH: 29.9 pg (ref 26.0–34.0)
MCHC: 34.7 g/dL (ref 30.0–36.0)
MCV: 86.2 fL (ref 80.0–100.0)
Platelets: 204 10*3/uL (ref 150–400)
RBC: 3.91 MIL/uL — ABNORMAL LOW (ref 4.22–5.81)
RDW: 12.7 % (ref 11.5–15.5)
WBC: 15.5 10*3/uL — ABNORMAL HIGH (ref 4.0–10.5)
nRBC: 0 % (ref 0.0–0.2)

## 2019-09-20 LAB — VANCOMYCIN, TROUGH: Vancomycin Tr: 17 ug/mL (ref 15–20)

## 2019-09-20 LAB — HEPARIN LEVEL (UNFRACTIONATED)
Heparin Unfractionated: 0.22 IU/mL — ABNORMAL LOW (ref 0.30–0.70)
Heparin Unfractionated: 0.39 IU/mL (ref 0.30–0.70)
Heparin Unfractionated: 0.33 IU/mL (ref 0.30–0.70)

## 2019-09-20 IMAGING — MR MR LUMBAR SPINE W/O CM
3 series · 18 of 48 positions shown · non-contrast
Comparison: CT of the abdomen [DATE]

CLINICAL DATA: Epidural abscess.

EXAM:
MRI LUMBAR SPINE WITHOUT CONTRAST
TECHNIQUE: Multiplanar, multisequence MR imaging of the lumbar spine was
performed. No intravenous contrast was administered.

[Series 3: T2 · sagittal · 4.0mm · 0.55mm/px · 10 of 16 slices shown]
[im 2/16]
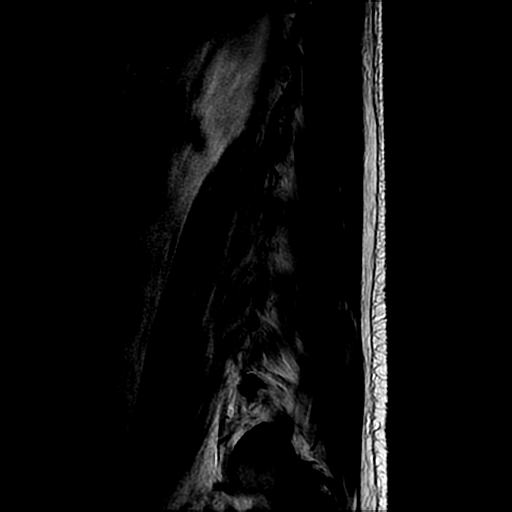
[im 3/16]
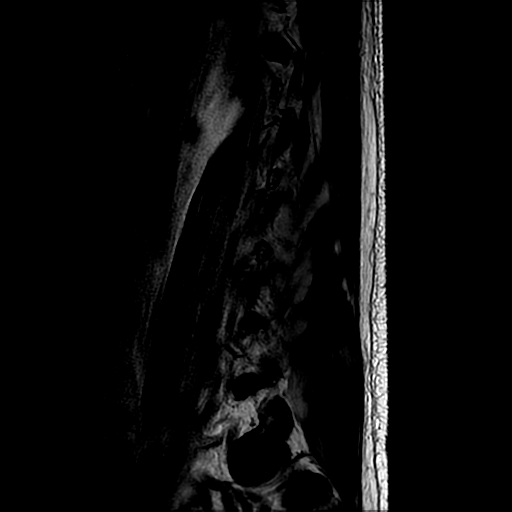
[im 4/16]
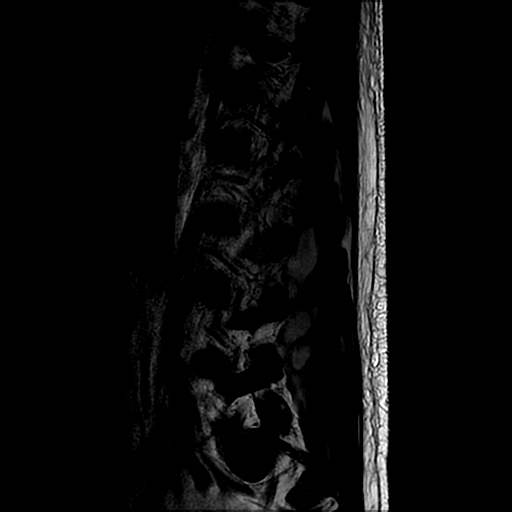
[im 6/16]
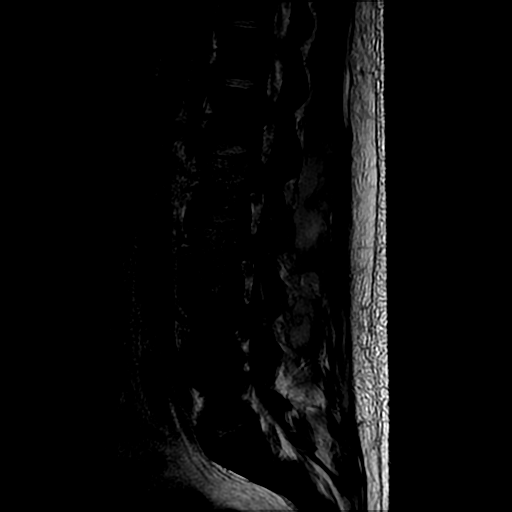
[im 8/16]
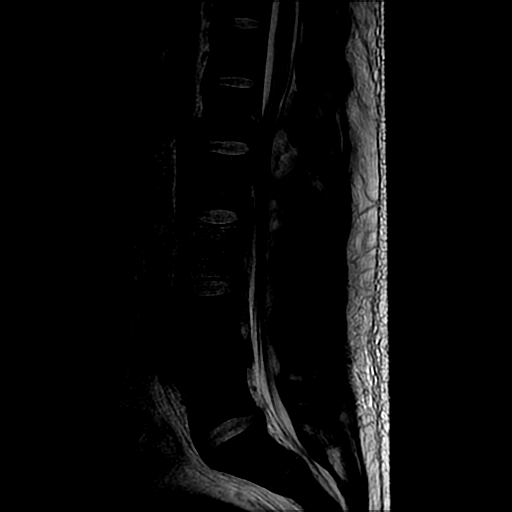
[im 9/16]
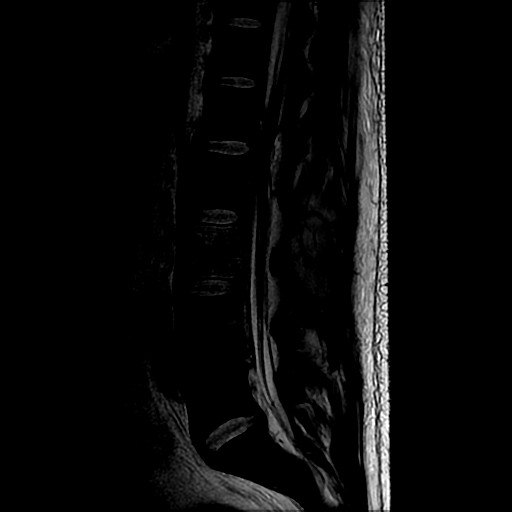
[im 10/16]
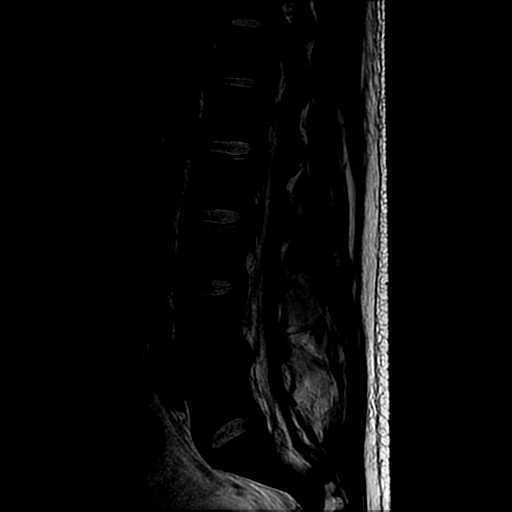
[im 12/16]
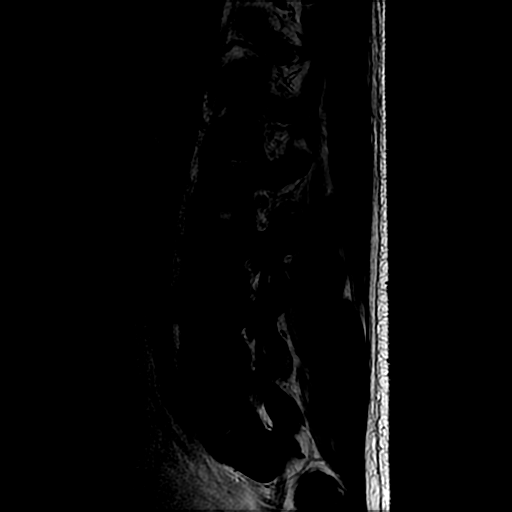
[im 14/16]
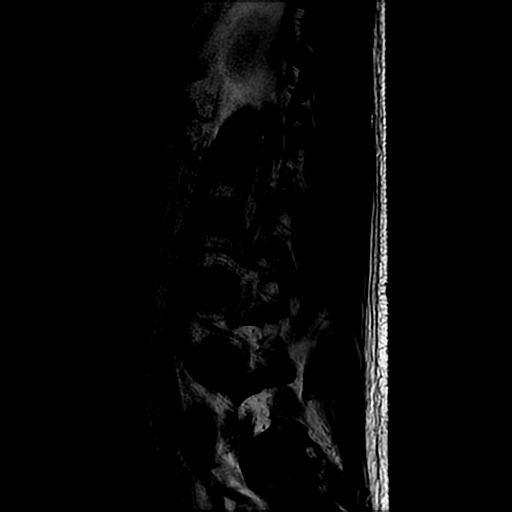
[im 16/16]
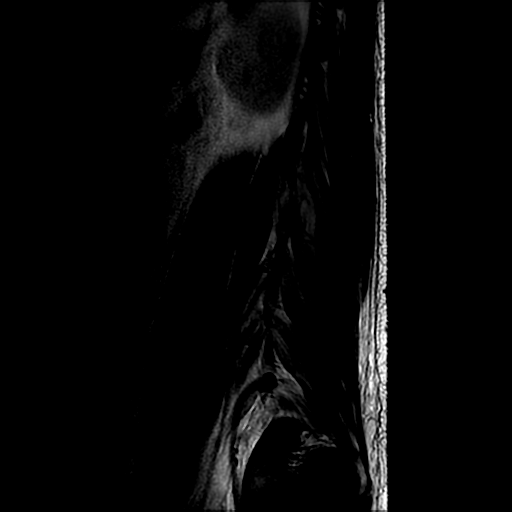

[Series 4: sag ir · sagittal · 4.0mm · 0.55mm/px · 3 of 16 slices shown]
[im 3/16]
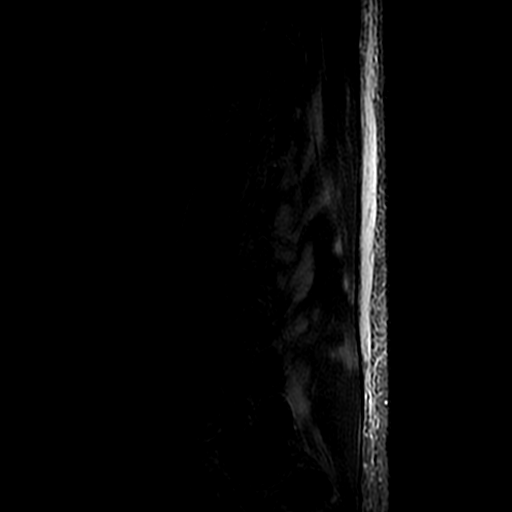
[im 9/16]
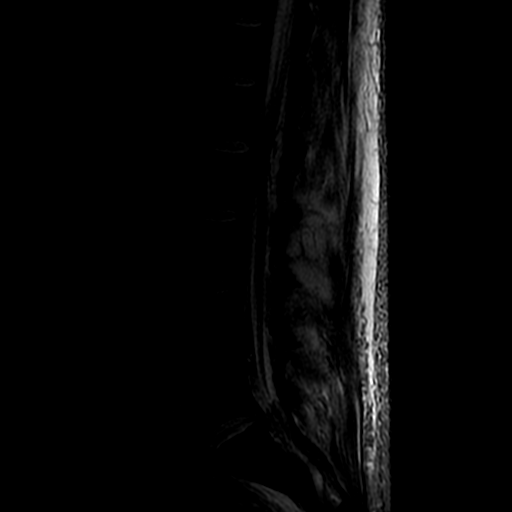
[im 14/16]
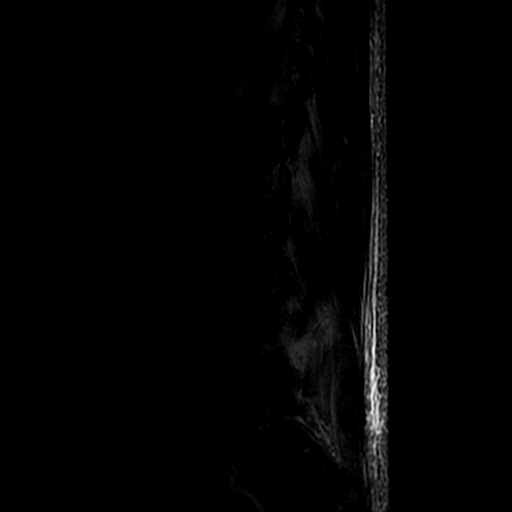

[Series 5: T1 · sagittal · 4.0mm · 0.55mm/px · 5 of 16 slices shown]
[im 2/16]
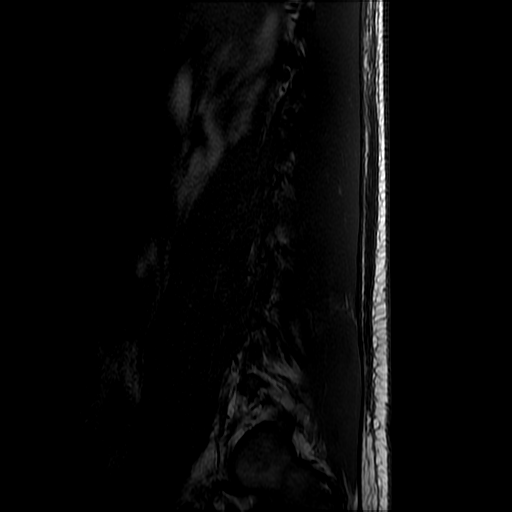
[im 3/16]
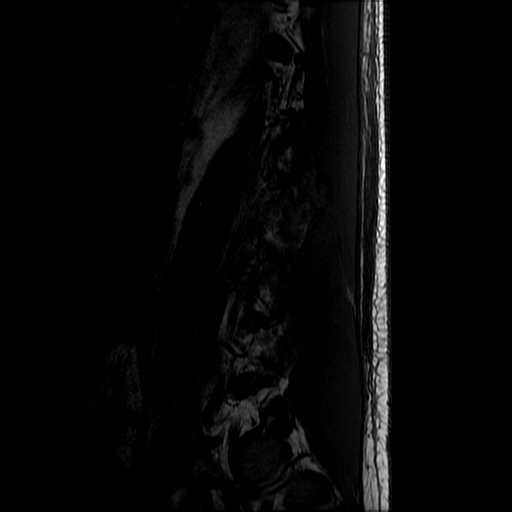
[im 4/16]
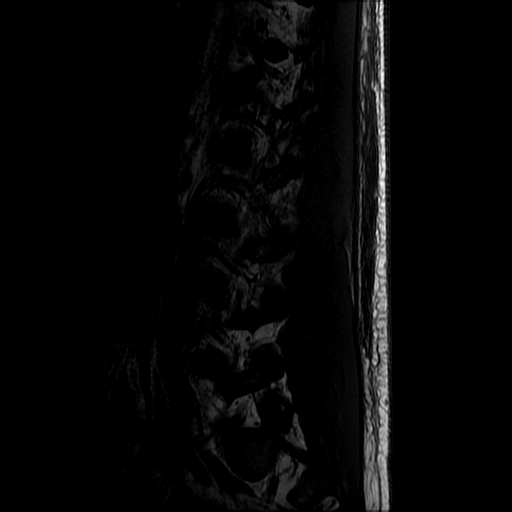
[im 9/16]
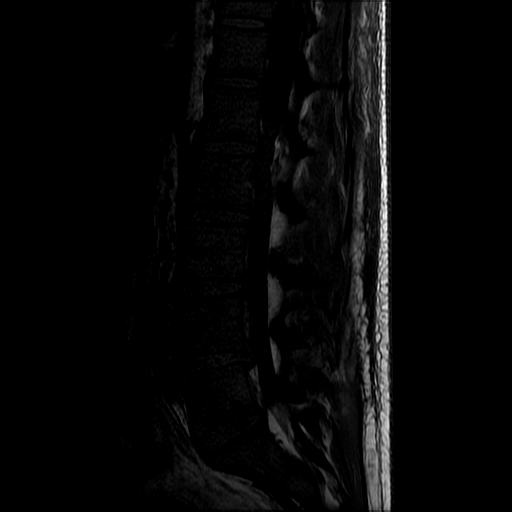
[im 14/16]
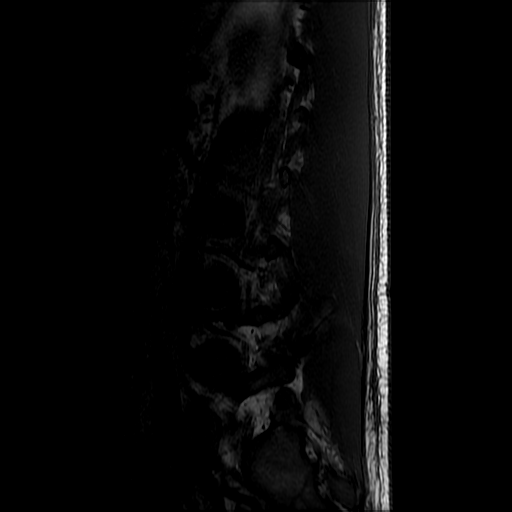

[18 of 48 positions shown; findings below may reference images not displayed]

FINDINGS: Limited study with only sagittal images obtained. Study was stopped
at patient's request

.

Segmentation: Standard.

Alignment:  Physiologic.

Vertebrae: Increased T2 signal within the L2 and L3 vertebrae, may
represent osteomyelitis. No endplate erosion or significant disc
changes seen.

Conus medullaris and cauda equina: Conus extends to the T12-L1
level. Conus and cauda equina appear normal.

Paraspinal and other soft tissues: A posterior epidural phlegmon is
seen from the level of the L1 superior endplate to the midbody of L3
(craniocaudal length a 8.7 cm) with at least 2 focal areas of more
increased T2 signal, suggestive of abscesses, measuring
approximately 1.7 cm and 1.2 cm at the L1-2 and L2-3 levels. Edema
of the right so was and paraspinal musculature is noted, suggestive
of myositis with multiple abscesses within the right paraspinal
musculature, as seen on prior CT.

Disc levels:

L1-2: Above described epidural phlegmon/abscess causes moderate to
severe compression of the thecal sac at this level. No neural
foraminal narrowing.

L2-3: The above described phlegmon/abscess causes mild narrowing of
the thecal sac. No neural foraminal narrowing.

L3-4: No spinal canal or neural foraminal stenosis.

L4-5: Small central disc protrusion with annular tear. No
significant spinal canal or neural foraminal stenosis.

L5-S1: No spinal canal or neural foraminal stenosis.
IMPRESSION: 1. Limited study with only sagittal images obtained.
2. Posterior epidural phlegmon/abscess from the level of the L1
superior endplate to the midbody of L3 (craniocaudal length a
cm) with at least 2 focal areas of more increased T2 signal,
suggestive of abscesses, measuring approximately 1.7 cm and 1.2 cm
at the L1-2 and L2-3 levels.
3. Edema of the right paraspinal musculature, suggestive of myositis
with multiple abscesses within the right paraspinal musculature, as
seen on prior CT.
4. Increased T2 signal within the L2 and L3 vertebrae, may represent
osteomyelitis. No endplate erosion or significant disc changes seen.
5. Mild narrowing of the thecal sac at L2-L3 and moderate to severe
compression of the thecal sac at L1-L2 due to the epidural
phlegmon/abscess.
6. These results will be called to the ordering clinician or
representative by the Radiologist Assistant, and communication
documented in the PACS or [REDACTED].

## 2019-09-20 IMAGING — MR MR THORACIC SPINE W/O CM
4 of 6 series · 18 of 48 positions shown · non-contrast
Comparison: Chest CT [DATE]

CLINICAL DATA: Mid back pain periods

EXAM:
MRI THORACIC SPINE WITHOUT CONTRAST
TECHNIQUE: Multiplanar, multisequence MR imaging of the thoracic spine was
performed. No intravenous contrast was administered.

[Series 4: counting loc · sagittal · 4.0mm · 0.90mm/px · 2 of 8 slices shown]
[im 1/8]
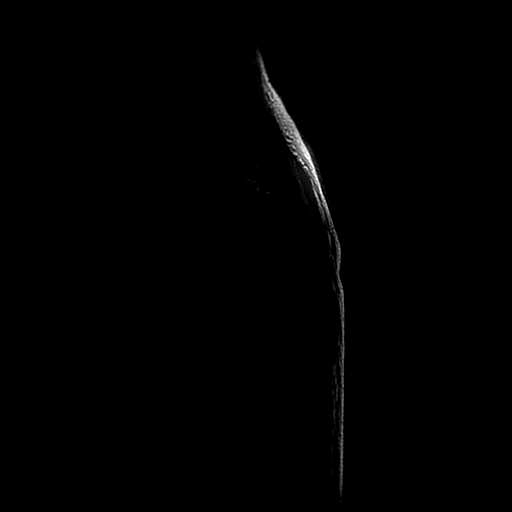
[im 8/8]
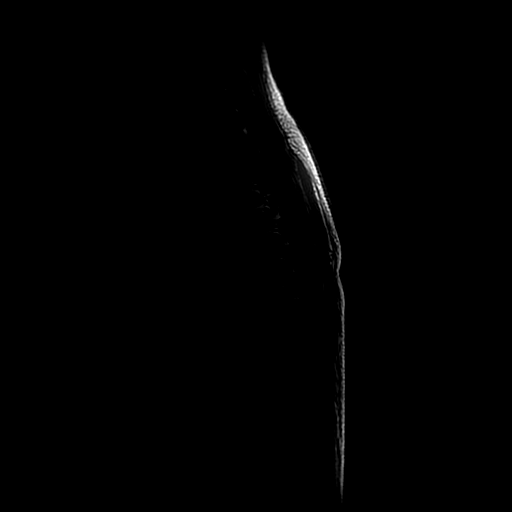

[Series 5: T2 · sagittal · 3.0mm · 0.66mm/px · 6 of 16 slices shown (1 of 2)]
[im 1/16]
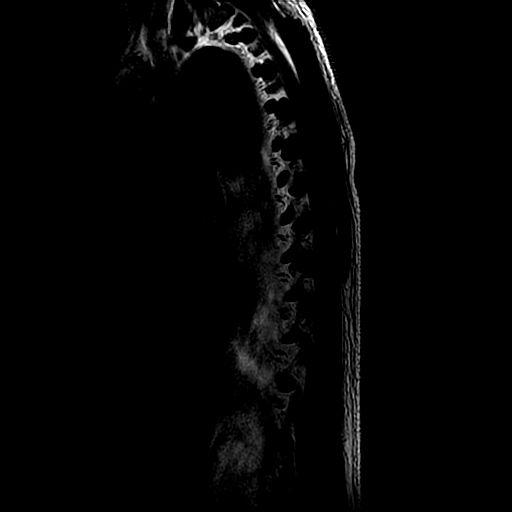
[im 4/16]
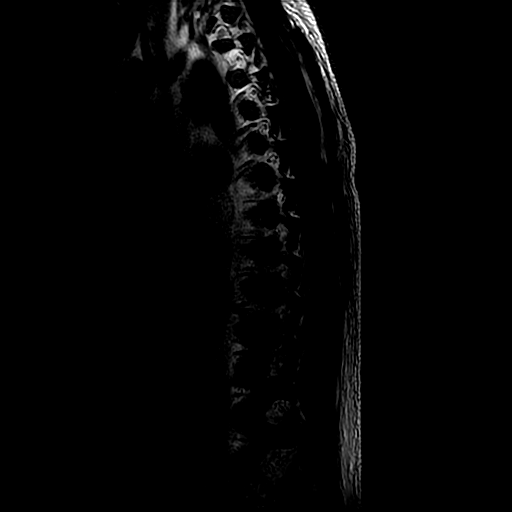
[im 7/16]
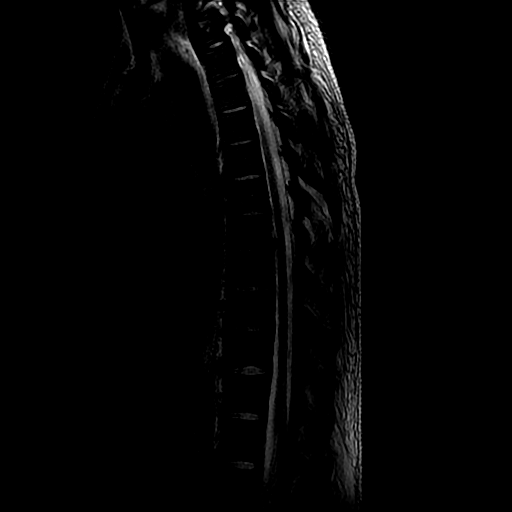
[im 10/16]
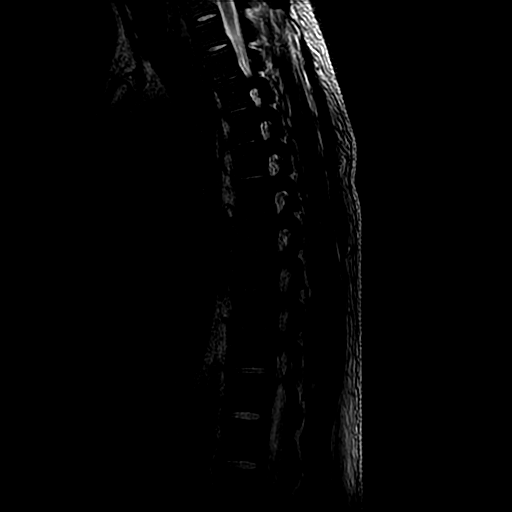
[im 13/16]
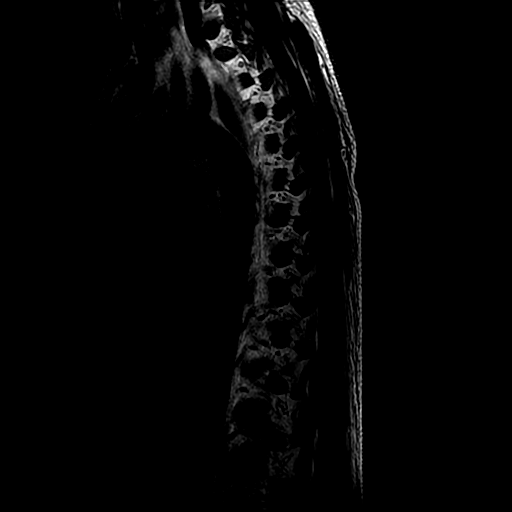
[im 16/16]
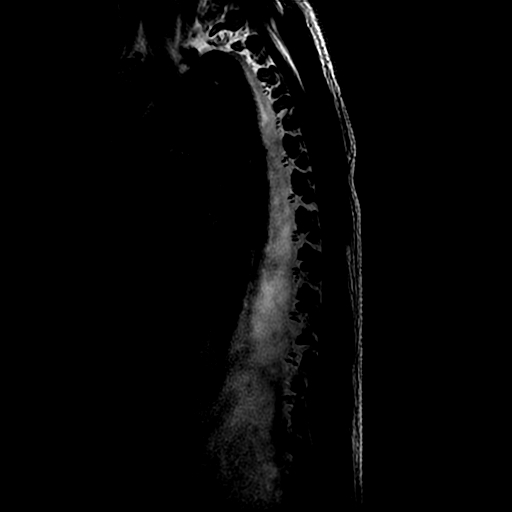

[Series 7: T1 · sagittal · 3.0mm · 0.66mm/px · 3 of 16 slices shown]
[im 4/16]
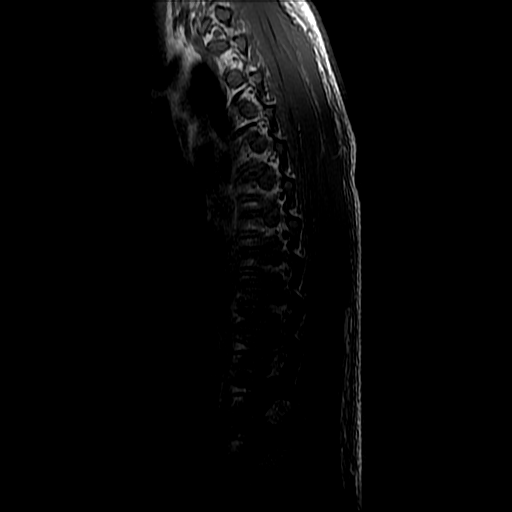
[im 10/16]
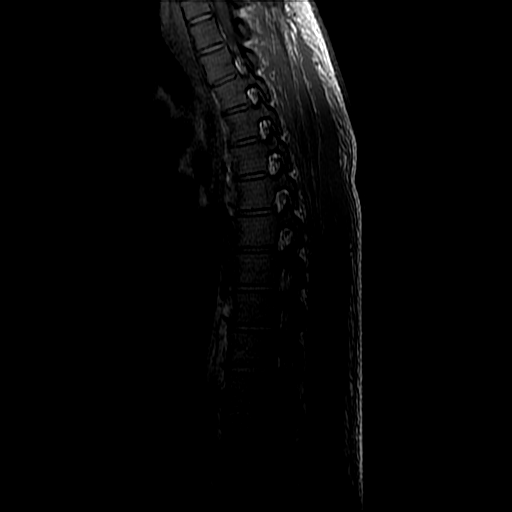
[im 16/16]
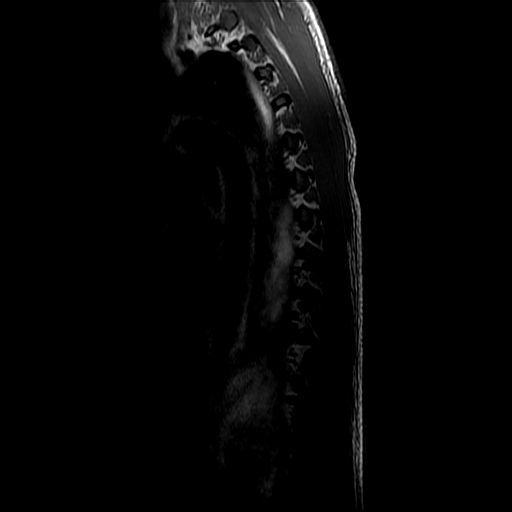

[Series 8: T2 · axial · 4.0mm · 0.43mm/px · z∈[-241,-25]mm · 7 of 36 slices shown (2 of 2)]
[im 1/36]
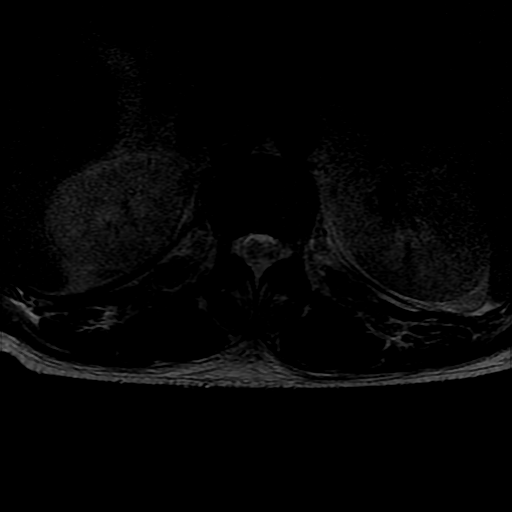
[im 6/36]
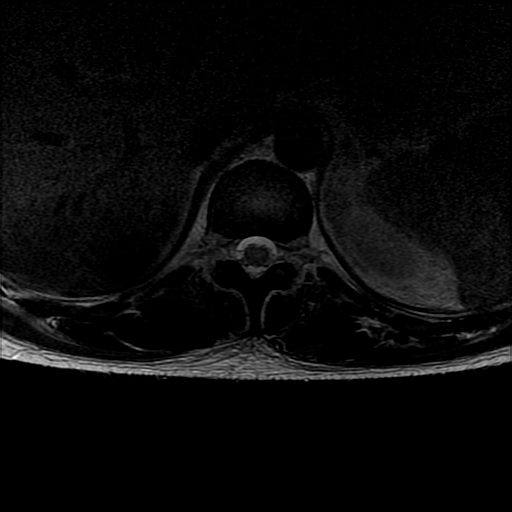
[im 11/36]
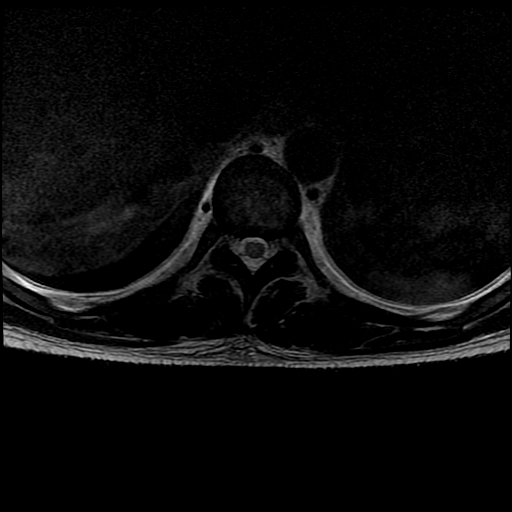
[im 17/36]
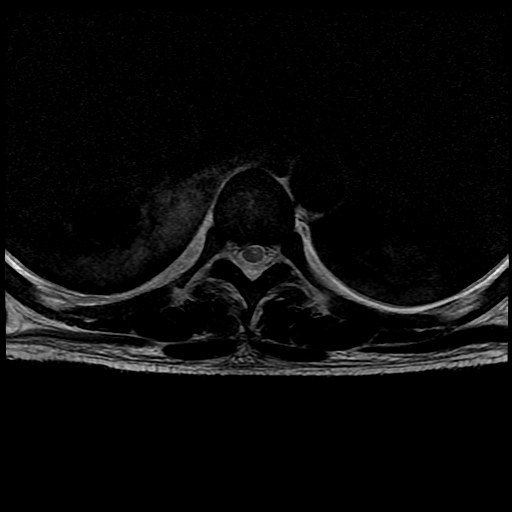
[im 19/36]
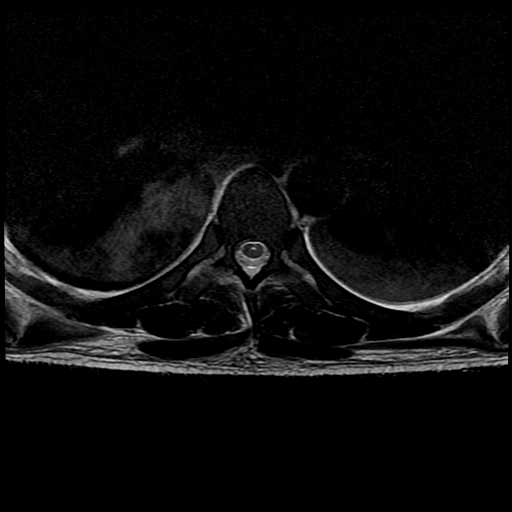
[im 25/36]
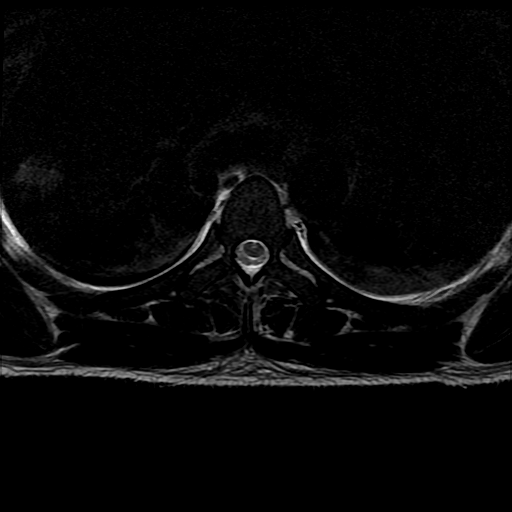
[im 30/36]
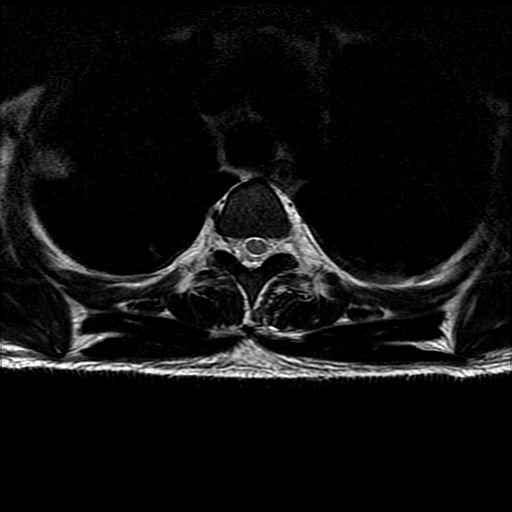

[18 of 48 positions shown; findings below may reference images not displayed]

FINDINGS: Alignment:  Physiologic.

Vertebrae: Diffuse decrease of the T1 signal of the bone marrow of
the visualized spine, may represent red marrow reconversion. No
fracture, evidence of discitis, or focal bone lesion.

Cord: Normal signal and morphology. Prominent central canal noted
throughout the thoracic cord

Paraspinal and other soft tissues: Bilateral pleural effusion and
multiple rounded pulmonary lesions, best described on prior chest
CT.

Disc levels:

T1-2: No spinal canal or neural foraminal stenosis.

T2-3: No spinal canal or neural foraminal stenosis.

T3-4: No spinal canal or neural foraminal stenosis.

C4-5: No spinal canal or neural foraminal stenosis.

C5-6: No spinal canal or neural foraminal stenosis.

C6-7: Tiny central disc protrusion causing small indentation on the
thecal sacand contacting the ventral aspect of the cord without cord
compression or significant spinal canal or neural foraminal
stenosis.

T7-8: Small right central disc protrusion causing indentation of the
thecal sac and contacting the ventral aspect of the cord without
cord compression or significant spinal canal or neural foraminal
stenosis.

T8-9: No spinal canal or neural foraminal stenosis.

T9-10: No spinal canal or neural foraminal stenosis.

T10-11: No spinal canal or neural foraminal stenosis.

T11-12: No spinal canal or neural foraminal stenosis
IMPRESSION: 1. Tiny central disc protrusions at C6-7 and T7-8 causing mild
indentation of the thecal sac and contacting the ventral aspect of
the cord without cord compression or significant spinal canal
stenosis.
2. Bilateral pleural effusion and multiple rounded pulmonary
lesions, best described on prior chest CT.

## 2019-09-20 MED ORDER — POTASSIUM CHLORIDE 10 MEQ/100ML IV SOLN
10.0000 meq | INTRAVENOUS | Status: AC
  Administered 2019-09-20 (×2): 10 meq via INTRAVENOUS
  Filled 2019-09-20 (×2): qty 100

## 2019-09-20 MED ORDER — POTASSIUM CHLORIDE CRYS ER 20 MEQ PO TBCR
40.0000 meq | EXTENDED_RELEASE_TABLET | Freq: Two times a day (BID) | ORAL | Status: AC
  Administered 2019-09-20 (×2): 40 meq via ORAL
  Filled 2019-09-20 (×2): qty 2

## 2019-09-20 MED ORDER — HEPARIN BOLUS VIA INFUSION
3000.0000 [IU] | Freq: Once | INTRAVENOUS | Status: AC
  Administered 2019-09-20: 3000 [IU] via INTRAVENOUS
  Filled 2019-09-20: qty 3000
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
## 2019-09-20 NOTE — Progress Notes (Addendum)
Patient's mom, Crystal was called and updated on patient status. Patient requested that I let her know his plan to go to the OR tomorrow am.   Patient's mother's phone number was updated in the computer.

## 2019-09-20 NOTE — Progress Notes (Signed)
ANTICOAGULATION CONSULT NOTE - Follow Up Consult  Pharmacy Consult for Heparin Indication: concern for non-occlusive IVC thrombus  Allergies  Allergen Reactions  . Tramadol Other (See Comments)    Stomach pain    Patient Measurements: Height: 5\' 7"  (170.2 cm) Weight: 81.7 kg (180 lb 1.9 oz) IBW/kg (Calculated) : 66.1 Heparin Dosing Weight: 81.7 kg  Vital Signs: Temp: 98.2 F (36.8 C) (07/02 1305) Temp Source: Oral (07/02 1305) BP: 122/86 (07/02 1305) Pulse Rate: 88 (07/02 1305)  Labs: Recent Labs    09/19/19 0239 09/19/19 1148 09/19/19 2051 09/20/19 0444 09/20/19 0737 09/20/19 1313  HGB 12.1*  --   --  11.7*  --   --   HCT 34.6*  --   --  33.7*  --   --   PLT 184  --   --  204  --   --   APTT 40*  --   --   --   --   --   LABPROT 14.3  --   --   --   --   --   INR 1.2  --   --   --   --   --   HEPARINUNFRC <0.10*   < > 0.17* 0.22*  --  0.39  CREATININE 0.62  --   --   --  1.23  --    < > = values in this interval not displayed.    Estimated Creatinine Clearance: 87.4 mL/min (by C-G formula based on SCr of 1.23 mg/dL).  Assessment:  34 y/o M transfer from [facility]. Pt presented with low back pain. Found to have MRSA bacteremia with ?paraspinal muscle abscesses. Incidental finding on CT of ?infrarenal IVC thrombus. Also with possible septic pulmonary emboli.      Transferred from [place] on IV heparin. After multiple reboluses and rate increases, heparin level is now therapeutic (0.39) on 2700 units/hr.   Goal of Therapy:  Heparin level 0.3-0.7 units/ml Monitor platelets by anticoagulation protocol: Yes   Plan:   Continue heparin drip at 2700 units/hr  Confirmatory heparin level tonight.  Daily heparin level and CBC.   [place], RPh Phone: [phone] 09/20/2019,2:45 PM

## 2019-09-20 NOTE — Progress Notes (Signed)
    [facility] has been requested to perform a transesophageal echocardiogram on Scott Green for bacteremia.  After careful review of history and examination, the risks and benefits of transesophageal echocardiogram have been explained including risks of esophageal damage, perforation (1:10,000 risk), bleeding, pharyngeal hematoma as well as other potential complications associated with conscious sedation including aspiration, arrhythmia, respiratory failure and death. Alternatives to treatment were discussed, questions were answered. Patient is willing to proceed.   Georgie Chard, NP  09/20/2019 4:06 PM

## 2019-09-20 NOTE — Progress Notes (Signed)
Pharmacy Antibiotic Note  Scott Green is a 34 y.o. male admitted on 09/18/2019 with MRSA bacteremia (cultures at [facility]) complicated by septic pulmonary emboli and vertebral infection, possible right paraspinal muscle abscesses. Pharmacy consulted for Vancomycin and Zoysn dosing. Zosyn stopped 7/1.  Will need prolonged course of IV antibiotics.    Vancomycin begun at [facility] and on 1gm IV q8h since transferred to [facility].    Creatinine 0.62 on admit and up to 1.23 today.    Vancomycin trough level this afternoon is 17 mcg/ml, on target.    Plan:  Continue Vancomycin 1gm IV q8hrs.  Bmet in am and at least every 3 days.  Vancomycin trough level at least weekly.  Target troughs 15-20 mcg/ml.  Height: 5\' 7"  (170.2 cm) Weight: 81.7 kg (180 lb 1.9 oz) IBW/kg (Calculated) : 66.1  Temp (24hrs), Avg:98.3 F (36.8 C), Min:97.9 F (36.6 C), Max:98.5 F (36.9 C)  Recent Labs  Lab 09/19/19 0239 09/20/19 0444 09/20/19 0737 09/20/19 1313  WBC 18.5* 15.5*  --   --   CREATININE 0.62  --  1.23  --   LATICACIDVEN 1.2  --   --   --   VANCOTROUGH  --   --   --  17    Estimated Creatinine Clearance: 87.4 mL/min (by C-G formula based on SCr of 1.23 mg/dL).    Allergies  Allergen Reactions  . Tramadol Other (See Comments)    Stomach pain    Antimicrobials this admission: Vancomycin 6/29>> Zosyn 6/29 >> 7/1  Dose adjustments this admission:  7/2: VT  = 17 mcg/ml on 1 gm IV q8h - no change  Microbiology results: 6/29 Blood at [facility]: MRSA 7/1 blood x 2: ng x 1 day to date 7/1 HIV: non-reactive 6/29 COVID: negative (at [facility]) 7/1 HIV: non-reactive  Thank you for allowing pharmacy to be a part of this patient's care.  9/1, Dennie Fetters Phone: [place] 09/20/2019 2:48 PM

## 2019-09-20 NOTE — Progress Notes (Signed)
[facility]  Date of Admission:  09/18/2019     Total days of antibiotics 3         ASSESSMENT:  Mr. [place]ter[place] CT chest with concern for septic emboli. TTE negative with no evidence of vegetation and preserved heart valve function. Will order TEE. Blood cultures from 7/1 are without growth to date. Awaiting MRI results once completed. Renal function is stable with no evidence of nephrotoxicity. Continue current dose of vancomycin. Hepatitis C antibody test is positive and will check HCV RNA level and genotype.   PLAN:  1. Continue vancomycin.  2. Monitor blood cultures for clearance of bacteremia.  3. Therapeutic drug monitoring of renal function and vancomycin levels. 4. TEE ordered.  5. Await MRI results when completed.  6. Check Hepatitis C RNA and genotype.   Dr. Daiva Eves is available over the weekend for ID questions or concerns.  Principal Problem:   MRSA bacteremia Active Problems:   Abscess of paraspinal muscles   Sepsis (HCC)   IVC thrombosis (HCC)   Septic embolism (HCC)   Cellulitis of flank   Chronic, continuous use of opioids   SUBJECTIVE:  Afebrile overnight with no acute events.  Allergies  Allergen Reactions  . Tramadol Other (See Comments)    Stomach pain     Review of Systems: Review of Systems  Constitutional: Negative for chills, fever and weight loss.  Respiratory: Negative for cough, shortness of breath and wheezing.   Cardiovascular: Negative for chest pain and leg swelling.  Gastrointestinal: Negative for abdominal pain, constipation, diarrhea, nausea and vomiting.  Musculoskeletal: Positive for back pain.  Skin: Negative for rash.      OBJECTIVE: Vitals:   09/19/19 2000 09/20/19 0020 09/20/19 0449 09/20/19 0833  BP: 116/80 113/74 137/70 123/74  Pulse: 91 94 99 93  Resp: 18 20 20 20   Temp: 98.3 F (36.8 C) 98.1 F (36.7 C) 97.9 F (36.6 C) 98.5 F (36.9 C)  TempSrc: Oral Oral Oral Oral  SpO2: 96%  97% 99% 95%  Weight:      Height:       Body mass index is 28.21 kg/m.  Physical Exam Constitutional:      General: He is not in acute distress.    Appearance: He is well-developed.  Cardiovascular:     Rate and Rhythm: Normal rate and regular rhythm.     Heart sounds: Normal heart sounds.  Pulmonary:     Effort: Pulmonary effort is normal.     Breath sounds: Normal breath sounds.  Skin:    General: Skin is warm and dry.  Neurological:     Mental Status: He is alert and oriented to person, place, and time.  Psychiatric:        Behavior: Behavior normal.        Thought Content: Thought content normal.        Judgment: Judgment normal.     Lab Results Lab Results  Component Value Date   WBC 15.5 (H) 09/20/2019   HGB 11.7 (L) 09/20/2019   HCT 33.7 (L) 09/20/2019   MCV 86.2 09/20/2019   PLT 204 09/20/2019    Lab Results  Component Value Date   CREATININE 1.23 09/20/2019   BUN 16 09/20/2019   NA 132 (L) 09/20/2019   K 2.8 (L) 09/20/2019   CL 101 09/20/2019   CO2 23 09/20/2019    Lab Results  Component Value Date   ALT 39 09/19/2019   AST 32  09/19/2019   ALKPHOS 79 09/19/2019   BILITOT 0.7 09/19/2019     Microbiology: Recent Results (from the past 240 hour(s))  Culture, blood (x 2)     Status: None (Preliminary result)   Collection Time: 09/19/19  2:39 AM   Specimen: BLOOD  Result Value Ref Range Status   Specimen Description BLOOD LEFT ARM  Final   Special Requests   Final    BOTTLES DRAWN AEROBIC AND ANAEROBIC Blood Culture adequate volume   Culture   Final    NO GROWTH 1 DAY Performed at [facility], [address], [place], [place] [address]    Report Status PENDING  Incomplete  Culture, blood (x 2)     Status: None (Preliminary result)   Collection Time: 09/19/19  2:39 AM   Specimen: BLOOD  Result Value Ref Range Status   Specimen Description BLOOD LEFT HAND  Final   Special Requests   Final    BOTTLES DRAWN AEROBIC AND ANAEROBIC Blood  Culture adequate volume   Culture   Final    NO GROWTH 1 DAY Performed at [facility], [address] [place], [place] [address]    Report Status PENDING  Incomplete     Marcos Eke, NP [facility]  09/20/2019  10:13 AM

## 2019-09-20 NOTE — Plan of Care (Signed)
Continue to monitor

## 2019-09-20 NOTE — Consult Note (Signed)
Neurosurgery Consultation  Reason for Consult: Epidural abscess Referring Physician: Regalado  CC: Back pain  HPI: This is a 34 y.o. man w/ h/o chronic LBP that presents with acute on chronic worsening of his LBP. Pain is in the midline in the lumbar region, severe in intensity, sharp in quality, no radiation into the legs, no new weakness, numbness, or parasthesias, no recent change in bowel or bladder function. No recent use of anti-platelet or anti-coagulant medications.   ROS: A 14 point ROS was performed and is negative except as noted in the HPI.   PMHx:  Past Medical History:  Diagnosis Date  . Chronic back pain    FamHx: History reviewed. No pertinent family history. SocHx:  reports that he has quit smoking. His smoking use included cigarettes. His smokeless tobacco use includes snuff. He reports current alcohol use of about 12.0 standard drinks of alcohol per week. He reports current drug use. Drug: Marijuana.  Exam: Vital signs in last 24 hours: Temp:  [97.9 F (36.6 C)-98.5 F (36.9 C)] 98.2 F (36.8 C) (07/02 1305) Pulse Rate:  [88-99] 88 (07/02 1305) Resp:  [18-20] 20 (07/02 1305) BP: (113-137)/(70-86) 122/86 (07/02 1305) SpO2:  [95 %-99 %] 98 % (07/02 1305) General: Awake, alert, cooperative, lying in bed, appears to be in significant pain Head: Normocephalic and atruamatic HEENT: Neck supple Pulmonary: breathing room air comfortably with some wheezing Cardiac: RRR Abdomen: S NT ND Extremities: Warm and well perfused x4 Neuro: AOx3, PERRL, EOMI, FS Strength 5/5 x4, SILTx4, reflexes 2+, no hoffman's, no clonus   Assessment and Plan: 34 y.o. man on chronic opiates for LBP with acute worsening in the setting of sepsis with pulmonary emboli, on heparin gtt. MRI L-spine personally reviewed, which shows posterior epidural abscesses at L1-2 and L2-3 with thecal sac compression that is worst at L1-2.   -OR in AM for MIS laminectomy and abscess drainage -NPO after  midnight -stop heparin drip at 4am, patient is second case -please call with any concerns or questions  Scott Pierini, MD 09/20/19 3:21 PM [facility]

## 2019-09-20 NOTE — Progress Notes (Signed)
ANTICOAGULATION CONSULT NOTE - Follow Up Consult  Pharmacy Consult for Heparin Indication: concern for non-occlusive IVC thrombus  Allergies  Allergen Reactions  . Tramadol Other (See Comments)    Stomach pain    Patient Measurements: Height: 5\' 7"  (170.2 cm) Weight: 81.7 kg (180 lb 1.9 oz) IBW/kg (Calculated) : 66.1 Heparin Dosing Weight: 81.7 kg  Vital Signs: Temp: 97.7 F (36.5 C) (07/02 2009) Temp Source: Oral (07/02 2009) BP: 126/76 (07/02 2009) Pulse Rate: 89 (07/02 2009)  Labs: Recent Labs    09/19/19 0239 09/19/19 1148 09/20/19 0444 09/20/19 0737 09/20/19 1313 09/20/19 2006  HGB 12.1*  --  11.7*  --   --   --   HCT 34.6*  --  33.7*  --   --   --   PLT 184  --  204  --   --   --   APTT 40*  --   --   --   --   --   LABPROT 14.3  --   --   --   --   --   INR 1.2  --   --   --   --   --   HEPARINUNFRC <0.10*   < > 0.22*  --  0.39 0.33  CREATININE 0.62  --   --  1.23  --   --    < > = values in this interval not displayed.    Estimated Creatinine Clearance: 87.4 mL/min (by C-G formula based on SCr of 1.23 mg/dL).  Assessment:  34 y/o M transfer from [facility]. Pt presented with low back pain. Found to have MRSA bacteremia with ?paraspinal muscle abscesses. Incidental finding on CT of ?infrarenal IVC thrombus. Also with possible septic pulmonary emboli.    Confirmatory heparin level this evening is therapeutic at 0.33. Per RN, IV infiltrated and heparin was paused briefly, so true level likely higher. Pt also reportedly had some bleeding with line dislodgement, so will not titrate further tonight. Heparin to stop at 0400 per neurosurgery.   Goal of Therapy:  Heparin level 0.3-0.7 units/ml Monitor platelets by anticoagulation protocol: Yes   Plan:  -Continue heparin 2700 units/h -Stop heparin at 0400 on 7/3 (tomorrow) -F/U restarting heparin post/op   9/3, PharmD, BCPS Clinical Pharmacist [phone] Please check AMION for all [facility]  Pharmacy numbers 09/20/2019

## 2019-09-20 NOTE — Progress Notes (Signed)
ANTICOAGULATION CONSULT NOTE   Pharmacy Consult for Heparin Indication: IVC thrombus  Allergies  Allergen Reactions  . Tramadol Other (See Comments)    Stomach pain    Patient Measurements: Height: 5\' 7"  (170.2 cm) Weight: 81.7 kg (180 lb 1.9 oz) IBW/kg (Calculated) : 66.1 Heparin Dosing Weight: 81.7 kg  Vital Signs: Temp: 97.9 F (36.6 C) (07/02 0449) Temp Source: Oral (07/02 0449) BP: 137/70 (07/02 0449) Pulse Rate: 99 (07/02 0449)  Labs: Recent Labs    09/19/19 0239 09/19/19 0239 09/19/19 1148 09/19/19 2051 09/20/19 0444  HGB 12.1*  --   --   --  11.7*  HCT 34.6*  --   --   --  33.7*  PLT 184  --   --   --  204  APTT 40*  --   --   --   --   LABPROT 14.3  --   --   --   --   INR 1.2  --   --   --   --   HEPARINUNFRC <0.10*   < > <0.10* 0.17* 0.22*  CREATININE 0.62  --   --   --   --    < > = values in this interval not displayed.    Estimated Creatinine Clearance: 134.3 mL/min (by C-G formula based on SCr of 0.62 mg/dL).  Assessment: 34 y.o. male with MRSA bacteremia and IVC thrombus on CT, for heparin  Goal of Therapy:  Heparin level 0.3-0.7 units/ml Monitor platelets by anticoagulation protocol: Yes   Plan:  Heparin 3000 units IV bolus, then increase heparin 2700 units/hr Check heparin level in 6 hours.   33, PharmD, BCPS    09/20/2019

## 2019-09-21 ENCOUNTER — Inpatient Hospital Stay (HOSPITAL_COMMUNITY): Payer: Medicaid Other

## 2019-09-21 ENCOUNTER — Encounter (HOSPITAL_COMMUNITY): Payer: Medicaid Other

## 2019-09-21 ENCOUNTER — Inpatient Hospital Stay (HOSPITAL_COMMUNITY): Payer: Medicaid Other | Admitting: Anesthesiology

## 2019-09-21 ENCOUNTER — Encounter (HOSPITAL_COMMUNITY)
Admission: EM | Disposition: A | Discharge status: Home / Self Care | Payer: Self-pay | Source: Other Acute Inpatient Hospital | Attending: Internal Medicine

## 2019-09-21 ENCOUNTER — Encounter (HOSPITAL_COMMUNITY): Payer: Self-pay | Admitting: Internal Medicine

## 2019-09-21 HISTORY — PX: LUMBAR LAMINECTOMY FOR EPIDURAL ABSCESS: SHX5956

## 2019-09-21 LAB — CBC
HCT: 30.1 % — ABNORMAL LOW (ref 39.0–52.0)
Hemoglobin: 10.6 g/dL — ABNORMAL LOW (ref 13.0–17.0)
MCH: 30.1 pg (ref 26.0–34.0)
MCHC: 35.2 g/dL (ref 30.0–36.0)
MCV: 85.5 fL (ref 80.0–100.0)
Platelets: 228 10*3/uL (ref 150–400)
RBC: 3.52 MIL/uL — ABNORMAL LOW (ref 4.22–5.81)
RDW: 12.7 % (ref 11.5–15.5)
WBC: 17.6 10*3/uL — ABNORMAL HIGH (ref 4.0–10.5)
nRBC: 0 % (ref 0.0–0.2)

## 2019-09-21 LAB — BASIC METABOLIC PANEL
Anion gap: 7 (ref 5–15)
BUN: 10 mg/dL (ref 6–20)
CO2: 20 mmol/L — ABNORMAL LOW (ref 22–32)
Calcium: 7.1 mg/dL — ABNORMAL LOW (ref 8.9–10.3)
Chloride: 105 mmol/L (ref 98–111)
Creatinine, Ser: 1.06 mg/dL (ref 0.61–1.24)
GFR calc Af Amer: >60 mL/min (ref >60)
GFR calc non Af Amer: >60 mL/min (ref >60)
Glucose, Bld: 102 mg/dL — ABNORMAL HIGH (ref 70–99)
Potassium: 3.2 mmol/L — ABNORMAL LOW (ref 3.5–5.1)
Sodium: 132 mmol/L — ABNORMAL LOW (ref 135–145)

## 2019-09-21 LAB — SURGICAL PCR SCREEN
MRSA, PCR: POSITIVE — AB
Staphylococcus aureus: POSITIVE — AB

## 2019-09-21 LAB — HEPATITIS C VRS RNA DETECT BY PCR-QUAL: Hepatitis C Vrs RNA by PCR-Qual: POSITIVE — AB

## 2019-09-21 LAB — HEPATITIS B SURFACE ANTIGEN: Hepatitis B Surface Ag: NONREACTIVE

## 2019-09-21 IMAGING — RF DG C-ARM 1-60 MIN
1 series · 2 of 2 positions shown · IV contrast (agent unspecified)
Comparison: MRI lumbar spine [DATE]

CLINICAL DATA: Lumbar laminectomy for epidural abscess L1-2.

EXAM:
DG C-ARM 1-60 MIN; LUMBAR SPINE - 2-3 VIEW
CONTRAST:  None.
FLUOROSCOPY TIME:  Fluoroscopy Time:  0 minutes 22 seconds.
Radiation Exposure Index (if provided by the fluoroscopic device):
Unknown.
Number of Acquired Spot Images: 2

[Series 1: run · 2 of 2 slices shown]
[im 1/2]
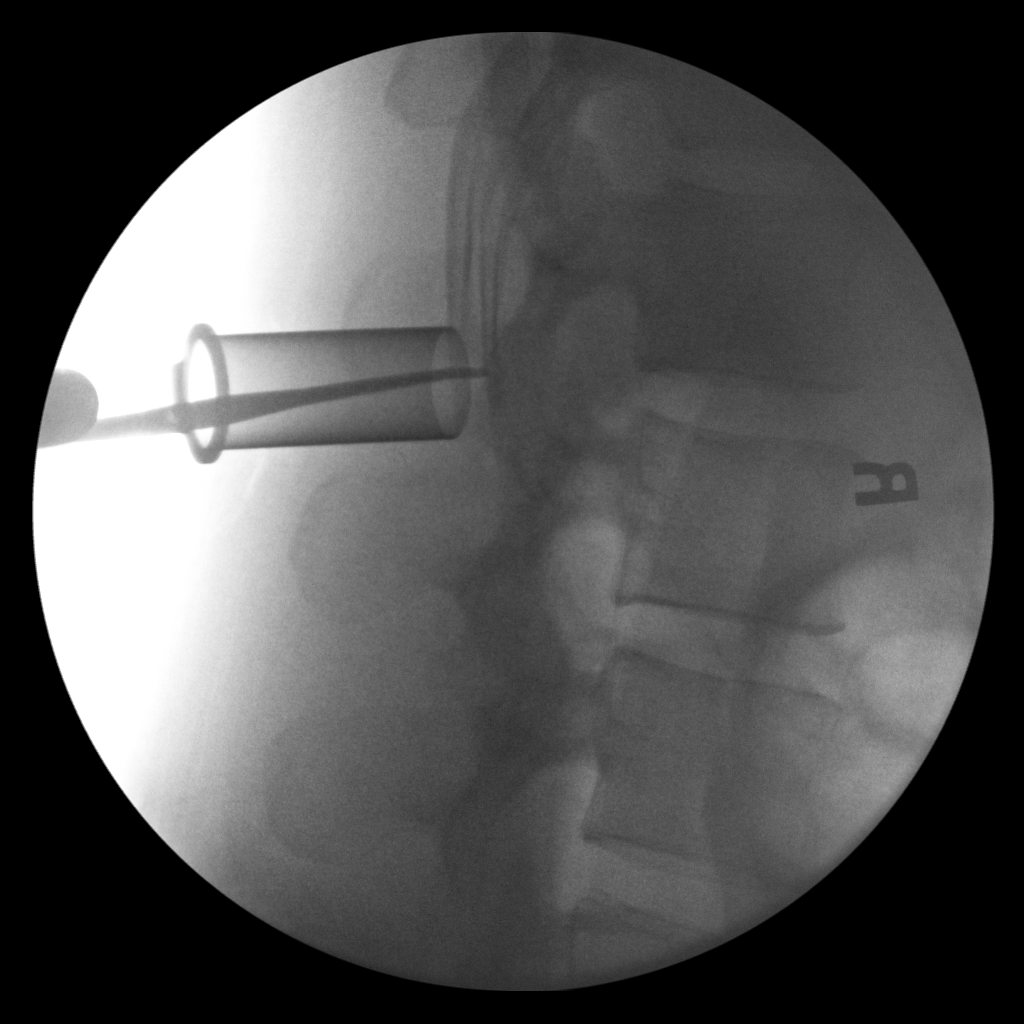
[im 2/2]
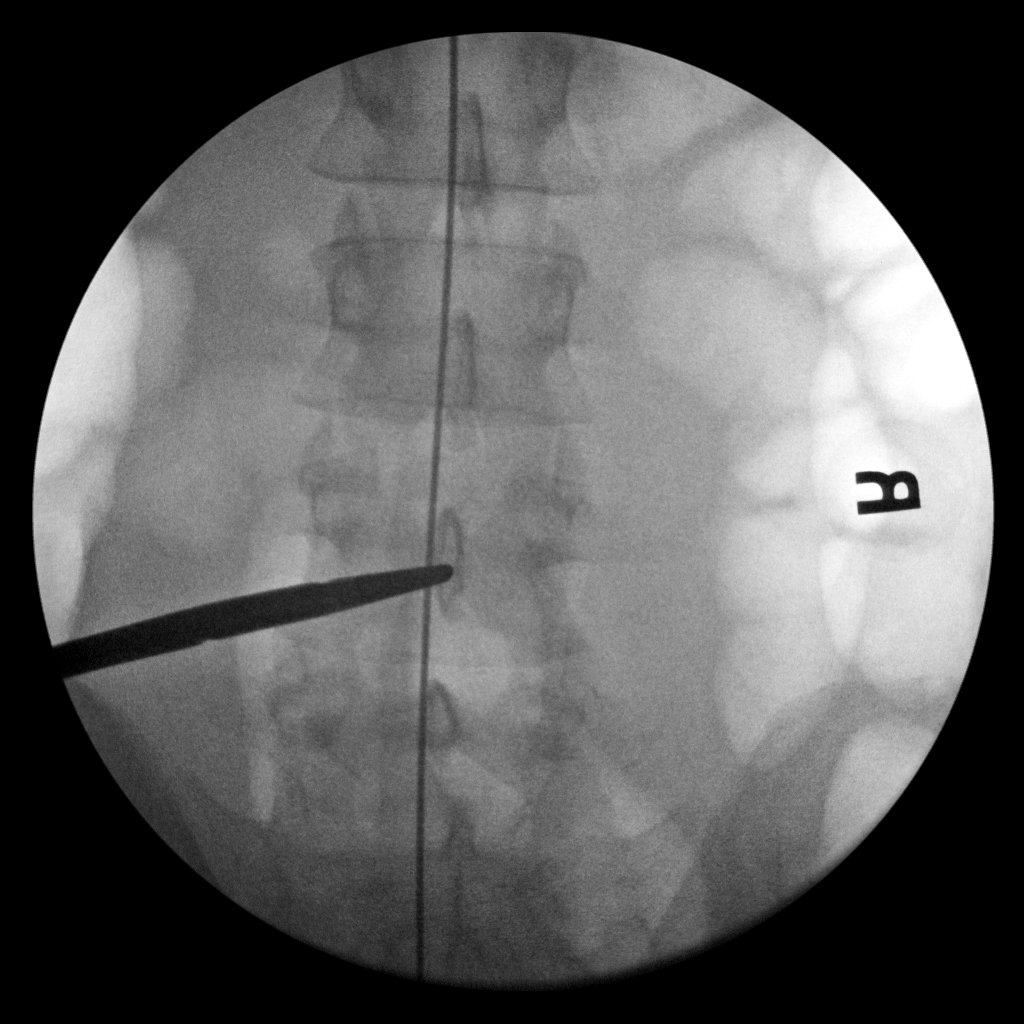

[2 of 2 positions shown; findings below may reference images not displayed]

FINDINGS: AP and lateral spot images over the thoracolumbar spine demonstrate
a surgical instrument from posterior approach with tip in the
midline of the lumbar spine. Difficult to accurately determine what
level the surgical instrument is at based on these images although
appears to be approximately L3 on the frontal image and
approximately L1-2 region on the lateral image. Recommend
correlation with findings at the time of the procedure.
IMPRESSION: Intraoperative images over the thoracolumbar spine as described.

## 2019-09-21 IMAGING — RF DG LUMBAR SPINE 2-3V
1 series · 2 of 2 positions shown · IV contrast (agent unspecified)
Comparison: MRI lumbar spine [DATE]

CLINICAL DATA: Lumbar laminectomy for epidural abscess L1-2.

EXAM:
DG C-ARM 1-60 MIN; LUMBAR SPINE - 2-3 VIEW
CONTRAST:  None.
FLUOROSCOPY TIME:  Fluoroscopy Time:  0 minutes 22 seconds.
Radiation Exposure Index (if provided by the fluoroscopic device):
Unknown.
Number of Acquired Spot Images: 2

[Series 1: run · 2 of 2 slices shown]
[im 1/2]
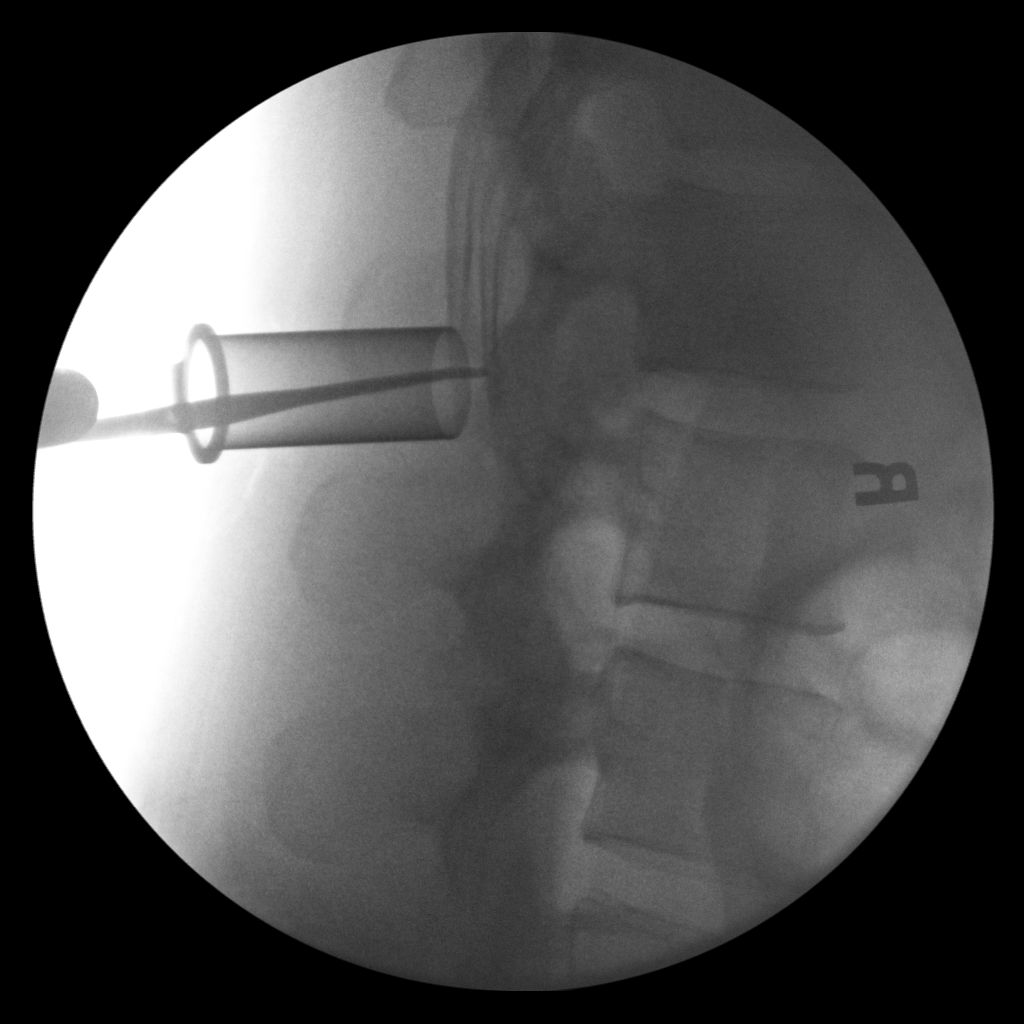
[im 2/2]
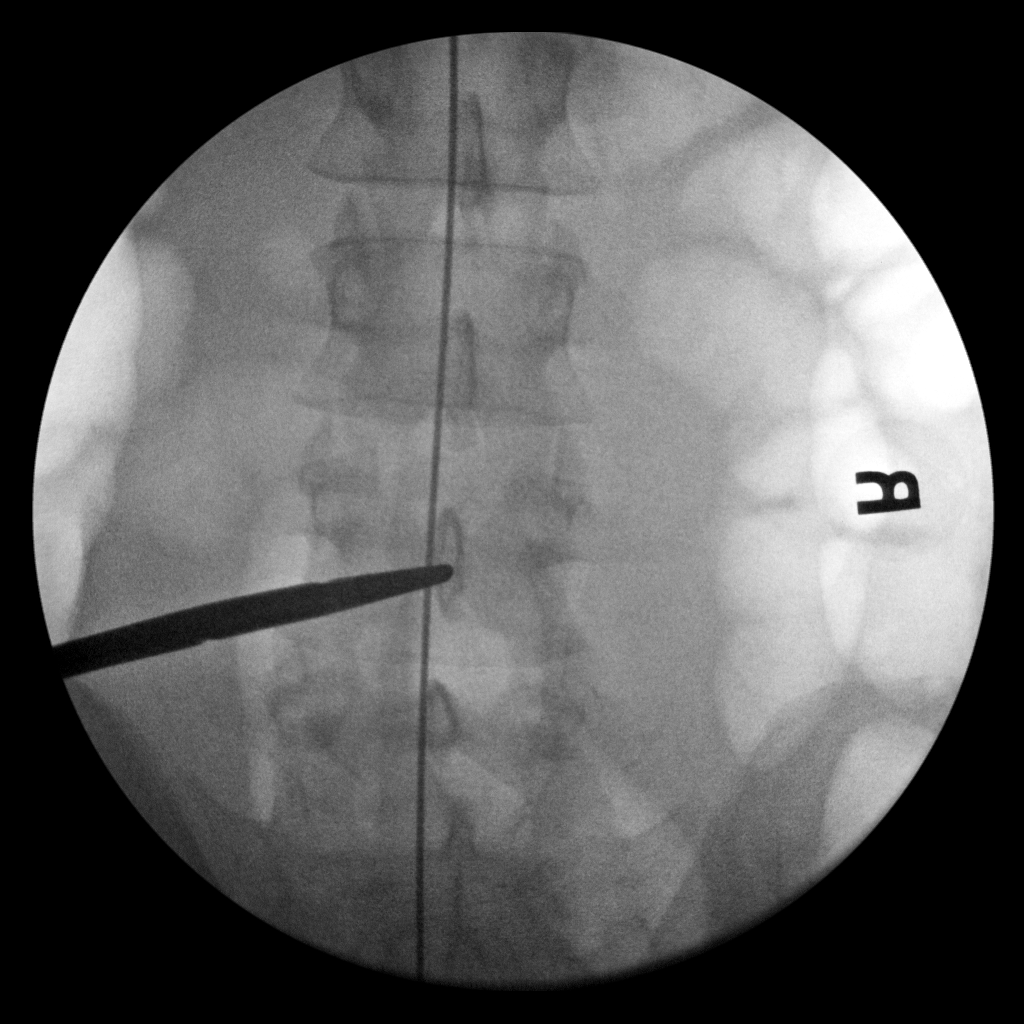

[2 of 2 positions shown; findings below may reference images not displayed]

FINDINGS: AP and lateral spot images over the thoracolumbar spine demonstrate
a surgical instrument from posterior approach with tip in the
midline of the lumbar spine. Difficult to accurately determine what
level the surgical instrument is at based on these images although
appears to be approximately L3 on the frontal image and
approximately L1-2 region on the lateral image. Recommend
correlation with findings at the time of the procedure.
IMPRESSION: Intraoperative images over the thoracolumbar spine as described.

## 2019-09-21 SURGERY — LUMBAR LAMINECTOMY FOR EPIDURAL ABSCESS
Anesthesia: General | Site: Back

## 2019-09-21 MED ORDER — HYDROMORPHONE HCL 1 MG/ML IJ SOLN
0.2500 mg | INTRAMUSCULAR | Status: DC | PRN
Start: 2019-09-21 — End: 2019-09-21

## 2019-09-21 MED ORDER — LIDOCAINE-EPINEPHRINE 1 %-1:100000 IJ SOLN
INTRAMUSCULAR | Status: AC
  Filled 2019-09-21: qty 1

## 2019-09-21 MED ORDER — SUFENTANIL CITRATE 50 MCG/ML IV SOLN
INTRAVENOUS | Status: DC | PRN
  Administered 2019-09-21 (×4): 10 ug via INTRAVENOUS

## 2019-09-21 MED ORDER — SUGAMMADEX SODIUM 200 MG/2ML IV SOLN
INTRAVENOUS | Status: DC | PRN
  Administered 2019-09-21: 200 mg via INTRAVENOUS

## 2019-09-21 MED ORDER — MUPIROCIN 2 % EX OINT
1.0000 "application " | TOPICAL_OINTMENT | Freq: Two times a day (BID) | CUTANEOUS | Status: AC
  Administered 2019-09-21 – 2019-09-25 (×9): 1 via NASAL
  Filled 2019-09-21: qty 22

## 2019-09-21 MED ORDER — CHLORHEXIDINE GLUCONATE 0.12 % MT SOLN
15.0000 mL | Freq: Once | OROMUCOSAL | Status: AC
  Administered 2019-09-21: 15 mL via OROMUCOSAL
  Filled 2019-09-21: qty 15

## 2019-09-21 MED ORDER — 0.9 % SODIUM CHLORIDE (POUR BTL) OPTIME
TOPICAL | Status: DC | PRN
  Administered 2019-09-21: 1000 mL

## 2019-09-21 MED ORDER — PROPOFOL 10 MG/ML IV BOLUS
INTRAVENOUS | Status: DC | PRN
  Administered 2019-09-21: 200 mg via INTRAVENOUS

## 2019-09-21 MED ORDER — THROMBIN 5000 UNITS EX SOLR
CUTANEOUS | Status: AC
  Filled 2019-09-21: qty 5000

## 2019-09-21 MED ORDER — MORPHINE SULFATE (PF) 2 MG/ML IV SOLN
2.0000 mg | Freq: Once | INTRAVENOUS | Status: AC
  Administered 2019-09-21: 2 mg via INTRAVENOUS

## 2019-09-21 MED ORDER — ROCURONIUM BROMIDE 10 MG/ML (PF) SYRINGE
PREFILLED_SYRINGE | INTRAVENOUS | Status: DC | PRN
  Administered 2019-09-21: 70 mg via INTRAVENOUS

## 2019-09-21 MED ORDER — CHLORHEXIDINE GLUCONATE 0.12 % MT SOLN
OROMUCOSAL | Status: AC
  Filled 2019-09-21: qty 15

## 2019-09-21 MED ORDER — CHLORHEXIDINE GLUCONATE CLOTH 2 % EX PADS
6.0000 | MEDICATED_PAD | Freq: Every day | CUTANEOUS | Status: AC
  Administered 2019-09-21 – 2019-09-25 (×5): 6 via TOPICAL

## 2019-09-21 MED ORDER — SUFENTANIL CITRATE 50 MCG/ML IV SOLN
INTRAVENOUS | Status: AC
  Filled 2019-09-21: qty 1

## 2019-09-21 MED ORDER — DEXAMETHASONE SODIUM PHOSPHATE 10 MG/ML IJ SOLN
INTRAMUSCULAR | Status: DC | PRN
  Administered 2019-09-21: 4 mg via INTRAVENOUS

## 2019-09-21 MED ORDER — ROCURONIUM BROMIDE 10 MG/ML (PF) SYRINGE
PREFILLED_SYRINGE | INTRAVENOUS | Status: AC
  Filled 2019-09-21: qty 10

## 2019-09-21 MED ORDER — LIDOCAINE 2% (20 MG/ML) 5 ML SYRINGE
INTRAMUSCULAR | Status: AC
  Filled 2019-09-21: qty 5

## 2019-09-21 MED ORDER — ONDANSETRON HCL 4 MG/2ML IJ SOLN
INTRAMUSCULAR | Status: DC | PRN
  Administered 2019-09-21: 4 mg via INTRAVENOUS

## 2019-09-21 MED ORDER — LACTATED RINGERS IV SOLN: INTRAVENOUS | Status: DC

## 2019-09-21 MED ORDER — POTASSIUM CHLORIDE 10 MEQ/100ML IV SOLN
10.0000 meq | INTRAVENOUS | Status: AC
  Administered 2019-09-21 (×2): 10 meq via INTRAVENOUS
  Filled 2019-09-21 (×4): qty 100

## 2019-09-21 MED ORDER — MIDAZOLAM HCL 5 MG/5ML IJ SOLN
INTRAMUSCULAR | Status: DC | PRN
  Administered 2019-09-21: 2 mg via INTRAVENOUS

## 2019-09-21 MED ORDER — MIDAZOLAM HCL 2 MG/2ML IJ SOLN
INTRAMUSCULAR | Status: AC
  Filled 2019-09-21: qty 2

## 2019-09-21 MED ORDER — PROPOFOL 10 MG/ML IV BOLUS
INTRAVENOUS | Status: AC
  Filled 2019-09-21: qty 40

## 2019-09-21 MED ORDER — LIDOCAINE-EPINEPHRINE 1 %-1:100000 IJ SOLN
INTRAMUSCULAR | Status: DC | PRN
  Administered 2019-09-21: 10 mL via INTRADERMAL

## 2019-09-21 MED ORDER — LIDOCAINE 2% (20 MG/ML) 5 ML SYRINGE
INTRAMUSCULAR | Status: DC | PRN
  Administered 2019-09-21: 100 mg via INTRAVENOUS

## 2019-09-21 MED ORDER — THROMBIN 5000 UNITS EX SOLR
OROMUCOSAL | Status: DC | PRN
  Administered 2019-09-21: 5 mL via TOPICAL

## 2019-09-21 MED ORDER — SODIUM CHLORIDE 0.9 % IV SOLN
INTRAVENOUS | Status: DC | PRN
  Administered 2019-09-21: 500 mL

## 2019-09-21 MED ORDER — LACTATED RINGERS IV SOLN: INTRAVENOUS | Status: DC | PRN

## 2019-09-21 SURGICAL SUPPLY — 58 items
ADH SKN CLS APL DERMABOND .7 (GAUZE/BANDAGES/DRESSINGS) ×1
BAG DECANTER FOR FLEXI CONT (MISCELLANEOUS) ×3 IMPLANT
BAND INSRT 18 STRL LF DISP RB (MISCELLANEOUS) ×2
BAND RUBBER #18 3X1/16 STRL (MISCELLANEOUS) ×6 IMPLANT
BLADE CLIPPER SURG (BLADE) IMPLANT
BLADE SURG 11 STRL SS (BLADE) ×3 IMPLANT
BUR 2.5 MTCH HD 16 (BUR) ×1 IMPLANT
BUR 2.5MM MTCH HD 16CM (BUR) ×1
BUR MATCHSTICK NEURO 3.0 LAGG (BURR) ×2 IMPLANT
BUR PRECISION FLUTE 5.0 (BURR) ×2 IMPLANT
CANISTER SUCT 3000ML PPV (MISCELLANEOUS) ×3 IMPLANT
COVER WAND RF STERILE (DRAPES) ×3 IMPLANT
DECANTER SPIKE VIAL GLASS SM (MISCELLANEOUS) ×3 IMPLANT
DERMABOND ADVANCED (GAUZE/BANDAGES/DRESSINGS) ×2
DERMABOND ADVANCED .7 DNX12 (GAUZE/BANDAGES/DRESSINGS) ×1 IMPLANT
DRAPE C-ARM 42X72 X-RAY (DRAPES) ×6 IMPLANT
DRAPE LAPAROTOMY 100X72X124 (DRAPES) ×3 IMPLANT
DRAPE MICROSCOPE LEICA (MISCELLANEOUS) ×3 IMPLANT
DRAPE SURG 17X23 STRL (DRAPES) ×3 IMPLANT
DURAPREP 26ML APPLICATOR (WOUND CARE) ×3 IMPLANT
ELECT REM PT RETURN 9FT ADLT (ELECTROSURGICAL) ×3
ELECTRODE REM PT RTRN 9FT ADLT (ELECTROSURGICAL) ×1 IMPLANT
GAUZE 4X4 16PLY RFD (DISPOSABLE) IMPLANT
GAUZE SPONGE 4X4 12PLY STRL (GAUZE/BANDAGES/DRESSINGS) IMPLANT
GLOVE BIO SURGEON STRL SZ 6.5 (GLOVE) ×2 IMPLANT
GLOVE BIO SURGEON STRL SZ7 (GLOVE) ×4 IMPLANT
GLOVE BIO SURGEON STRL SZ7.5 (GLOVE) ×3 IMPLANT
GLOVE BIO SURGEONS STRL SZ 6.5 (GLOVE) ×2
GLOVE BIOGEL PI IND STRL 7.5 (GLOVE) ×1 IMPLANT
GLOVE BIOGEL PI INDICATOR 7.5 (GLOVE) ×2
GLOVE EXAM NITRILE LRG STRL (GLOVE) IMPLANT
GLOVE EXAM NITRILE XL STR (GLOVE) IMPLANT
GLOVE EXAM NITRILE XS STR PU (GLOVE) IMPLANT
GOWN STRL REUS W/ TWL LRG LVL3 (GOWN DISPOSABLE) ×2 IMPLANT
GOWN STRL REUS W/ TWL XL LVL3 (GOWN DISPOSABLE) IMPLANT
GOWN STRL REUS W/TWL 2XL LVL3 (GOWN DISPOSABLE) IMPLANT
GOWN STRL REUS W/TWL LRG LVL3 (GOWN DISPOSABLE) ×9
GOWN STRL REUS W/TWL XL LVL3 (GOWN DISPOSABLE)
HEMOSTAT POWDER KIT SURGIFOAM (HEMOSTASIS) ×3 IMPLANT
KIT BASIN OR (CUSTOM PROCEDURE TRAY) ×3 IMPLANT
KIT TURNOVER KIT B (KITS) ×3 IMPLANT
NDL HYPO 18GX1.5 BLUNT FILL (NEEDLE) IMPLANT
NDL SPNL 18GX3.5 QUINCKE PK (NEEDLE) ×1 IMPLANT
NEEDLE HYPO 18GX1.5 BLUNT FILL (NEEDLE) IMPLANT
NEEDLE HYPO 22GX1.5 SAFETY (NEEDLE) ×3 IMPLANT
NEEDLE SPNL 18GX3.5 QUINCKE PK (NEEDLE) IMPLANT
NS IRRIG 1000ML POUR BTL (IV SOLUTION) ×3 IMPLANT
PACK LAMINECTOMY NEURO (CUSTOM PROCEDURE TRAY) ×3 IMPLANT
PAD ARMBOARD 7.5X6 YLW CONV (MISCELLANEOUS) ×7 IMPLANT
SPONGE LAP 4X18 RFD (DISPOSABLE) IMPLANT
SUT MNCRL AB 3-0 PS2 18 (SUTURE) ×3 IMPLANT
SUT VIC AB 0 CT1 18XCR BRD8 (SUTURE) ×1 IMPLANT
SUT VIC AB 0 CT1 8-18 (SUTURE) ×3
SUT VIC AB 2-0 CT2 18 VCP726D (SUTURE) ×3 IMPLANT
SYR 3ML LL SCALE MARK (SYRINGE) IMPLANT
TOWEL GREEN STERILE (TOWEL DISPOSABLE) ×3 IMPLANT
TOWEL GREEN STERILE FF (TOWEL DISPOSABLE) ×3 IMPLANT
WATER STERILE IRR 1000ML POUR (IV SOLUTION) ×3 IMPLANT

## 2019-09-21 NOTE — Progress Notes (Addendum)
Patient has been complaining of left groin feeling like a cramp or pulled muscle. Always 10 out of ten on pain scale. Hurts with some movement . Patient has received Morphine 2 mg I.V. x 2 Vicodan 2 tablets x 2 and Toradol 30 mg I.V.  x 1 and multi. Heat packs to left groin. R.N. has been aware and text page [place] at 01:12. Good pulses and also doppler pulses.

## 2019-09-21 NOTE — Anesthesia Postprocedure Evaluation (Signed)
Anesthesia Post Note  Patient: GRAESYN SCHREIFELS  Procedure(s) Performed: LUMBAR LAMINECTOMY FOR EPIDURAL ABSCESS MINIMALLY INVASIVE Lumbar one -Lumbar two (N/A Back)     Patient location during evaluation: PACU Anesthesia Type: General Level of consciousness: awake and alert Pain management: pain level controlled Vital Signs Assessment: post-procedure vital signs reviewed and stable Respiratory status: spontaneous breathing, nonlabored ventilation, respiratory function stable and patient connected to nasal cannula oxygen Cardiovascular status: blood pressure returned to baseline and stable Postop Assessment: no apparent nausea or vomiting Anesthetic complications: no   No complications documented.  Last Vitals:  Vitals:   09/21/19 1015 09/21/19 1048  BP: 123/70 129/76  Pulse: 99 87  Resp: 19 18  Temp: 36.8 C (!) 36.4 C  SpO2: 99% 98%    Last Pain:  Vitals:   09/21/19 1400  TempSrc:   PainSc: 10-Worst pain ever                 Rhaya Coale S

## 2019-09-21 NOTE — Anesthesia Procedure Notes (Signed)
Procedure Name: Intubation Date/Time: 09/21/2019 8:45 AM Performed by: Moshe Salisbury, CRNA Pre-anesthesia Checklist: Patient identified, Emergency Drugs available, Suction available and Patient being monitored Patient Re-evaluated:Patient Re-evaluated prior to induction Oxygen Delivery Method: Circle System Utilized Preoxygenation: Pre-oxygenation with 100% oxygen Induction Type: IV induction Ventilation: Mask ventilation without difficulty Laryngoscope Size: Mac and 3 Grade View: Grade I Tube type: Oral Tube size: 7.5 mm Number of attempts: 1 Airway Equipment and Method: Stylet Placement Confirmation: ETT inserted through vocal cords under direct vision,  positive ETCO2 and breath sounds checked- equal and bilateral Secured at: 22 cm Tube secured with: Tape Dental Injury: Teeth and Oropharynx as per pre-operative assessment
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
## 2019-09-21 NOTE — Op Note (Signed)
PATIENT: Scott Green  DAY OF SURGERY: 09/21/19   PRE-OPERATIVE DIAGNOSIS:  Lumbar stenosis with neurogenic claudication   POST-OPERATIVE DIAGNOSIS:  Lumbar stenosis with neurogenic claudication   PROCEDURE:  Minimally invasive L1-L2 laminectomy for evacuation of epidural abscess   SURGEON:  Surgeon(s) and Role:    Jadene Pierini, MD - Primary   ANESTHESIA: ETGA   BRIEF HISTORY: This is a 34 year old man who presented with severe back pain and was found to be bacteremic with septic pulmonary emboli, possible IVC thrombus, and lumbar epidural abscess with severe canal stenosis. I therefore recommended evacuation of the lumbar abscess to improve his severe pain and prevent neurologic decline. We discussed risks, benefits, and alternatives and he wished to proceed.    OPERATIVE DETAIL: The patient was taken to the operating room and placed on the OR table in the prone position. A formal time out was performed with two patient identifiers and confirmed the operative site. Anesthesia was induced by the anesthesia team. The operative site was marked, hair was clipped with surgical clippers, the area was then prepped and draped in a sterile fashion. Fluoroscopy was used to identify the surgical level with a spinal needle. A 2cm incision was then marked 1cm off to the right of midline. The fascia was incised sharply and serial dilators were docked to the spinolaminar interface on L1. A final tubular distractor was placed and secured to the table and fluoro again confirmed the correct surgical level. The spinous process, lamina, and facet locations were confirmed for orientation. A high speed drill was then used to perform an L1 laminotomy. With drilling complete, the ligamentum flavum was then resected and copious purulent material was immediately evident in the epidural space. This was sent for culture x2 and copiously irrigated. The abscess cavity was probed with a ball-tipped probe in all  directions to loosen any loculations and express as much purulent material as possible from the abscess cavity, especially caudally. Hemostasis was obtained, the wound was copiously irrigated, and the tube was removed while using the microscope to confirm hemostasis of the muscle edges. All instrument and sponge counts were correct and the incision was then closed in layers. The patient was then returned to anesthesia for emergence. No apparent complications at the completion of the procedure.   EBL:  22mL   DRAINS: none   SPECIMENS: Lumbar epidural abscess culture swabs x2 sent to microbiology   Jadene Pierini, MD 09/21/19 8:26 AM

## 2019-09-21 NOTE — Progress Notes (Signed)
Neurosurgery Service Progress Note  Subjective: No acute events overnight, stable pain, no new weakness, heparin gtt d/c'd this morning   Objective: Vitals:   09/20/19 2338 09/21/19 0000 09/21/19 0001 09/21/19 0405  BP: 139/76 116/70  130/80  Pulse:  86 86 73  Resp: 20 (!) 36 20 20  Temp: 98 F (36.7 C)   98.1 F (36.7 C)  TempSrc: Oral   Oral  SpO2: 100% 100%  100%  Weight:      Height:       Temp (24hrs), Avg:98.2 F (36.8 C), Min:97.7 F (36.5 C), Max:98.8 F (37.1 C)  CBC Latest Ref Rng & Units 09/21/2019 09/20/2019 09/19/2019  WBC 4.0 - 10.5 K/uL 17.6(H) 15.5(H) 18.5(H)  Hemoglobin 13.0 - 17.0 g/dL 10.6(L) 11.7(L) 12.1(L)  Hematocrit 39 - 52 % 30.1(L) 33.7(L) 34.6(L)  Platelets 150 - 400 K/uL 228 204 184   BMP Latest Ref Rng & Units 09/21/2019 09/20/2019 09/19/2019  Glucose 70 - 99 mg/dL 024(O) 973(Z) 88  BUN 6 - 20 mg/dL 10 16 10   Creatinine 0.61 - 1.24 mg/dL 3.29 9.24  Sodium 135 - 145 mmol/L 132(L) 132(L) 137  Potassium 3.5 - 5.1 mmol/L 3.2(L) 2.8(L) 4.1  Chloride 98 - 111 mmol/L 105 101 113(H)  CO2 22 - 32 mmol/L 20(L) 23 19(L)  Calcium 8.9 - 10.3 mg/dL 7.1(L) 7.3(L) 8.3(L)    Intake/Output Summary (Last 24 hours) at 09/21/2019 0824 Last data filed at 09/21/2019 0409 Gross per 24 hour  Intake 3825.99 ml  Output 2050 ml  Net 1775.99 ml    Current Facility-Administered Medications:  .  0.9 %  sodium chloride infusion, , Intravenous, Continuous, Regalado, Belkys A, MD, Last Rate: 100 mL/hr at 09/21/19 0338, New Bag at 09/21/19 0338 .  [MAR Hold] acetaminophen (TYLENOL) tablet 650 mg, 650 mg, Oral, Q6H PRN **OR** [MAR Hold] acetaminophen (TYLENOL) suppository 650 mg, 650 mg, Rectal, Q6H PRN, 11/22/19 V, MD .  chlorhexidine (PERIDEX) 0.12 % solution, , , ,  .  [MAR Hold] Chlorhexidine Gluconate Cloth 2 % PADS 6 each, 6 each, Topical, Q0600, Regalado, Belkys A, MD .  [place] Royal Hold] HYDROcodone-acetaminophen (NORCO/VICODIN) 5-325 MG per tablet 1-2 tablet, 1-2 tablet,  Oral, Q4H PRN, Mitzi Hansen, MD, 2 tablet at 09/21/19 0504 .  [MAR Hold] ketorolac (TORADOL) 30 MG/ML injection 30 mg, 30 mg, Intravenous, Q6H PRN, 11/22/19, MD, 30 mg at 09/20/19 2341 .  lactated ringers infusion, , Intravenous, Continuous, 2342, MD .  Eilene Ghazi Hold] morphine 2 MG/ML injection 2 mg, 2 mg, Intravenous, Q2H PRN, Mitzi Hansen, MD, 2 mg at 09/20/19 2245 .  [MAR Hold] mupirocin ointment (BACTROBAN) 2 % 1 application, 1 application, Nasal, BID, Regalado, Belkys A, MD .  2246 Hold] ondansetron (ZOFRAN) tablet 4 mg, 4 mg, Oral, Q6H PRN **OR** [MAR Hold] ondansetron (ZOFRAN) injection 4 mg, 4 mg, Intravenous, Q6H PRN, Mitzi Hansen, MD .  Andris Baumann Hold] potassium chloride 10 mEq in 100 mL IVPB, 10 mEq, Intravenous, Q1 Hr x 4, Regalado, Belkys A, MD .  Mitzi Hansen Hold] vancomycin (VANCOCIN) IVPB 1000 mg/200 mL premix, 1,000 mg, Intravenous, Q8H, Mitzi Hansen, RPH, Stopped at 09/21/19 0107   Physical Exam: Strength 5/5x4 but pain limited, SILTx4  Assessment & Plan: 34 y.o. 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

## 2019-09-21 NOTE — Progress Notes (Signed)
Dr. Loney Loh return call and aware of the above. Order to give Morphine 2 mg I.V. now

## 2019-09-21 NOTE — Anesthesia Preprocedure Evaluation (Signed)
Anesthesia Evaluation  Patient identified by MRN, date of birth, ID band Patient awake    Reviewed: Allergy & Precautions, NPO status , Patient's Chart, lab work & pertinent test results  Airway Mallampati: II  TM Distance: >3 FB Neck ROM: Full    Dental no notable dental hx.    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,   Endo/Other  negative endocrine ROS  Renal/GU negative Renal ROS  negative genitourinary   Musculoskeletal negative musculoskeletal ROS (+)   Abdominal   Peds negative pediatric ROS (+)  Hematology  (+) anemia ,   Anesthesia Other Findings   Reproductive/Obstetrics negative OB ROS                             Anesthesia Physical Anesthesia Plan  ASA: III and emergent  Anesthesia Plan: General   Post-op Pain Management:    Induction: Intravenous  PONV Risk Score and Plan: 2 and Ondansetron, Dexamethasone and Treatment may vary due to age or medical condition  Airway Management Planned: Oral ETT  Additional Equipment:   Intra-op Plan:   Post-operative Plan: Extubation in OR  Informed Consent: I have reviewed the patients History and Physical, chart, labs and discussed the procedure including the risks, benefits and alternatives for the proposed anesthesia with the patient or authorized representative who has indicated his/her understanding and acceptance.     Dental advisory given  Plan Discussed with: CRNA and Surgeon  Anesthesia Plan Comments:         Anesthesia Quick Evaluation

## 2019-09-21 NOTE — Plan of Care (Signed)
Poc progressing.  

## 2019-09-21 NOTE — Transfer of Care (Signed)
Immediate Anesthesia Transfer of Care Note  Patient: Scott Green  Procedure(s) Performed: LUMBAR LAMINECTOMY FOR EPIDURAL ABSCESS MINIMALLY INVASIVE Lumbar one -Lumbar two (N/A Back)  Patient Location: PACU  Anesthesia Type:General  Level of Consciousness: awake and patient cooperative  Airway & Oxygen Therapy: Patient Spontanous Breathing and Patient connected to nasal cannula oxygen  Post-op Assessment: Report given to RN, Post -op Vital signs reviewed and stable and Patient moving all extremities  Post vital signs: Reviewed and stable  Last Vitals:  Vitals Value Taken Time  BP 123/70 09/21/19 1010  Temp    Pulse 103 09/21/19 1012  Resp 21 09/21/19 1012  SpO2 100 % 09/21/19 1012  Vitals shown include unvalidated device data.  Last Pain:  Vitals:   09/21/19 0503  TempSrc:   PainSc: 10-Worst pain ever      Patients Stated Pain Goal: 0 (09/20/19 0930)  Complications: No complications documented.

## 2019-09-21 NOTE — Brief Op Note (Signed)
09/18/2019 - 09/21/2019  9:59 AM  PATIENT:  Scott Green  34 y.o. male  PRE-OPERATIVE DIAGNOSIS:  EPIDURAL ABCESS  POST-OPERATIVE DIAGNOSIS:  EPIDURAL ABCESS  PROCEDURE:  Procedure(s): LUMBAR LAMINECTOMY FOR EPIDURAL ABSCESS MINIMALLY INVASIVE Lumbar one -Lumbar two (N/A)  SURGEON:  Surgeon(s) and Role:    * Mansoor Hillyard, Clovis Pu, MD - Primary  PHYSICIAN ASSISTANT:   ASSISTANTS: none   ANESTHESIA:   general  EBL:  5cc  BLOOD ADMINISTERED:none  DRAINS: none   LOCAL MEDICATIONS USED:  LIDOCAINE   SPECIMEN:  Source of Specimen:  Epidural abscess  DISPOSITION OF SPECIMEN:  Microbiology  COUNTS:  YES  TOURNIQUET:  * No tourniquets in log *  DICTATION: .Note written in EPIC  PLAN OF CARE: Admit to inpatient   PATIENT DISPOSITION:  PACU - hemodynamically stable.   Delay start of Pharmacological VTE agent (>24hrs) due to surgical blood loss or risk of bleeding: yes

## 2019-09-22 ENCOUNTER — Inpatient Hospital Stay (HOSPITAL_COMMUNITY): Payer: Medicaid Other

## 2019-09-22 ENCOUNTER — Encounter (HOSPITAL_COMMUNITY): Payer: Self-pay | Admitting: Neurological Surgery

## 2019-09-22 DIAGNOSIS — M7989 Other specified soft tissue disorders: Secondary | ICD-10-CM

## 2019-09-22 LAB — COMPREHENSIVE METABOLIC PANEL
ALT: 25 U/L (ref 0–44)
AST: 23 U/L (ref 15–41)
Albumin: 1.4 g/dL — ABNORMAL LOW (ref 3.5–5.0)
Alkaline Phosphatase: 53 U/L (ref 38–126)
Anion gap: 9 (ref 5–15)
BUN: 12 mg/dL (ref 6–20)
CO2: 19 mmol/L — ABNORMAL LOW (ref 22–32)
Calcium: 7.3 mg/dL — ABNORMAL LOW (ref 8.9–10.3)
Chloride: 105 mmol/L (ref 98–111)
Creatinine, Ser: 1.2 mg/dL (ref 0.61–1.24)
GFR calc Af Amer: >60 mL/min (ref >60)
GFR calc non Af Amer: >60 mL/min (ref >60)
Glucose, Bld: 122 mg/dL — ABNORMAL HIGH (ref 70–99)
Potassium: 3.5 mmol/L (ref 3.5–5.1)
Sodium: 133 mmol/L — ABNORMAL LOW (ref 135–145)
Total Bilirubin: 0.4 mg/dL (ref 0.3–1.2)
Total Protein: 5.1 g/dL — ABNORMAL LOW (ref 6.5–8.1)

## 2019-09-22 LAB — CBC
HCT: 30.4 % — ABNORMAL LOW (ref 39.0–52.0)
Hemoglobin: 10.5 g/dL — ABNORMAL LOW (ref 13.0–17.0)
MCH: 29.6 pg (ref 26.0–34.0)
MCHC: 34.5 g/dL (ref 30.0–36.0)
MCV: 85.6 fL (ref 80.0–100.0)
Platelets: 235 10*3/uL (ref 150–400)
RBC: 3.55 MIL/uL — ABNORMAL LOW (ref 4.22–5.81)
RDW: 13.1 % (ref 11.5–15.5)
WBC: 16.8 10*3/uL — ABNORMAL HIGH (ref 4.0–10.5)
nRBC: 0 % (ref 0.0–0.2)

## 2019-09-22 IMAGING — CT CT CTA ABD/PEL W/CM AND/OR W/O CM
2 of 7 series · 14 of 46 positions shown, 16 images · IV contrast (omnipaque)
Comparison: [DATE], MR [DATE]

CLINICAL DATA: 34-year-old male with a history of IVC thrombosis

Diagnosis of posterior epidural phlegmon/abscess on prior MRI
[DATE]. Surgical decompression/evacuation [DATE]
EXAM:
CTA ABDOMEN AND PELVIS WITHOUT AND WITH CONTRAST
TECHNIQUE: Multidetector CT imaging of the abdomen and pelvis was performed
using the standard protocol during bolus administration of
intravenous contrast. Multiplanar reconstructed images and MIPs were
obtained and reviewed to evaluate the vascular anatomy.
CONTRAST:  100mL OMNIPAQUE IOHEXOL 350 MG/ML SOLN

[Series 4: venous thins · axial · portal-venous · 0.75mm/px · z∈[-465,-7]mm · 11 of 910 slices shown, 13 images]
[im 73/910  soft-tissue]
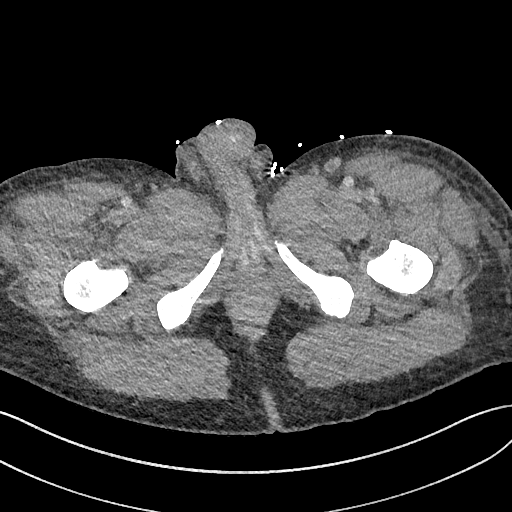
[im 73/910  bone]
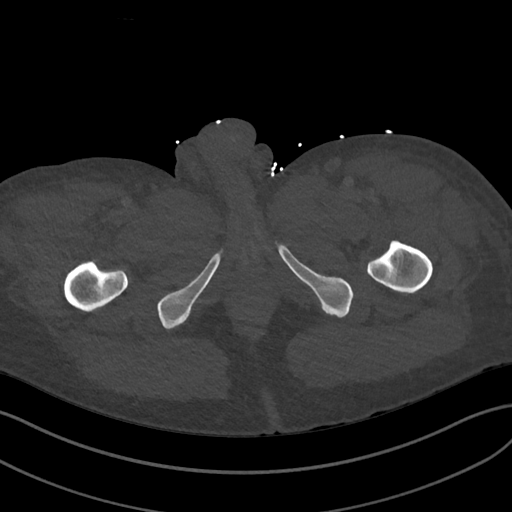
[im 146/910  soft-tissue]
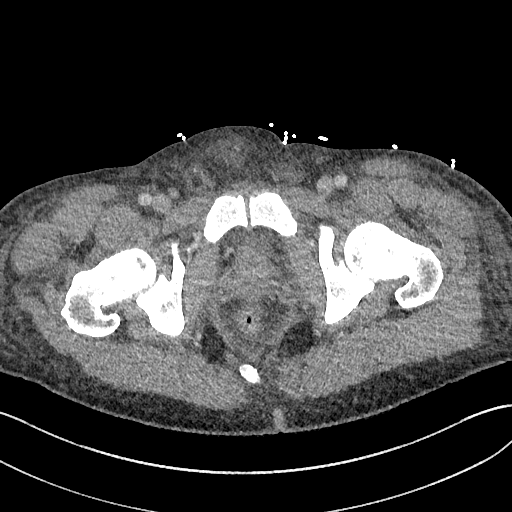
[im 219/910  soft-tissue]
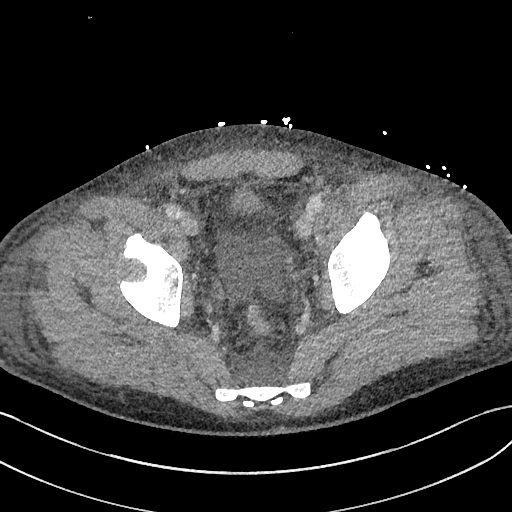
[im 291/910  soft-tissue]
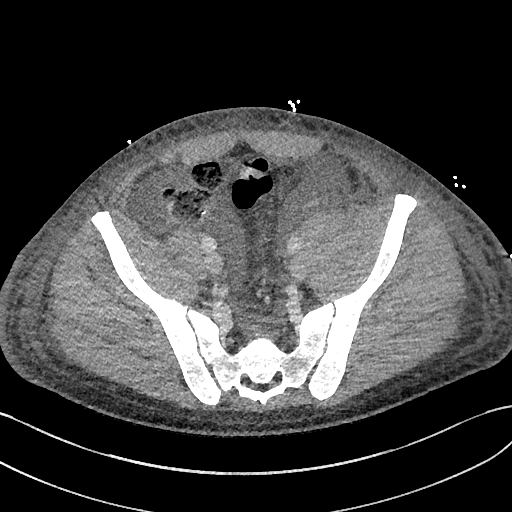
[im 364/910  soft-tissue]
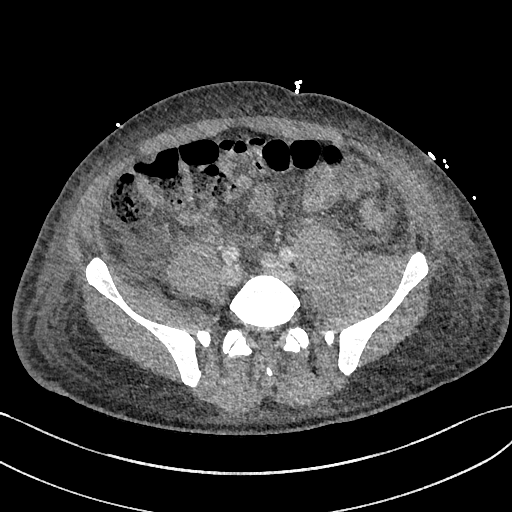
[im 473/910  soft-tissue]
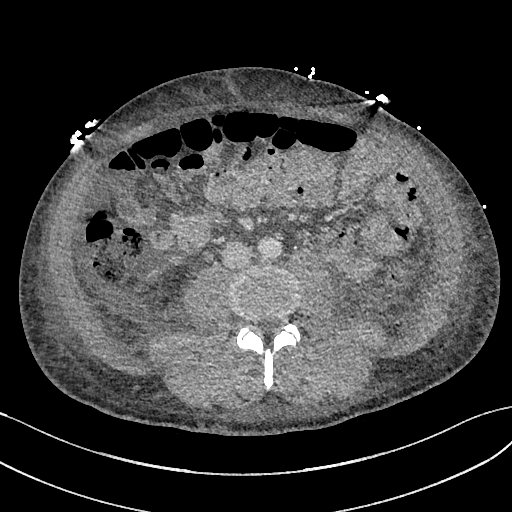
[im 546/910  soft-tissue]
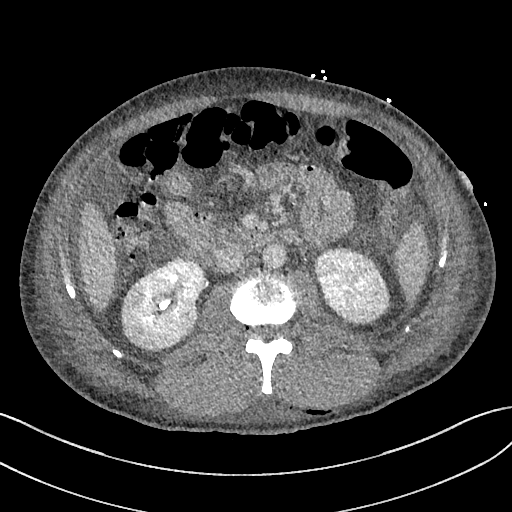
[im 619/910  soft-tissue]
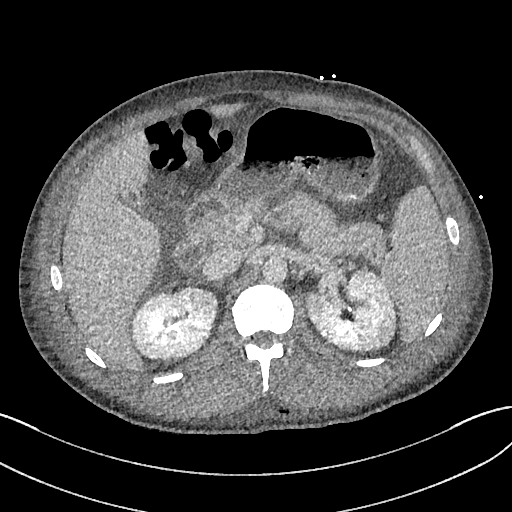
[im 691/910  soft-tissue]
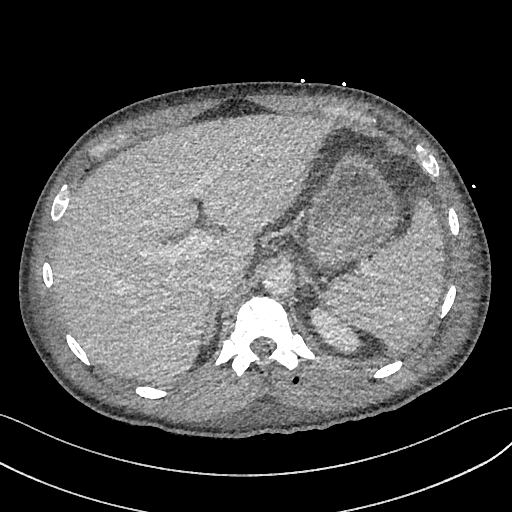
[im 691/910  bone]
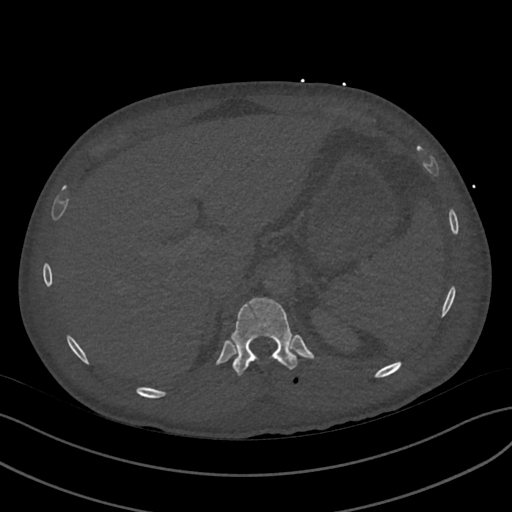
[im 764/910  soft-tissue]
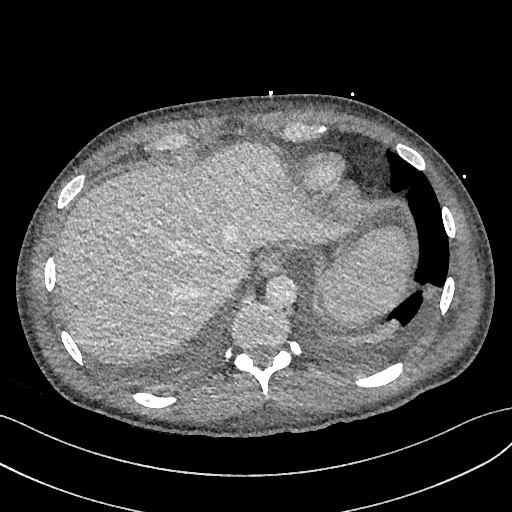
[im 837/910  soft-tissue]
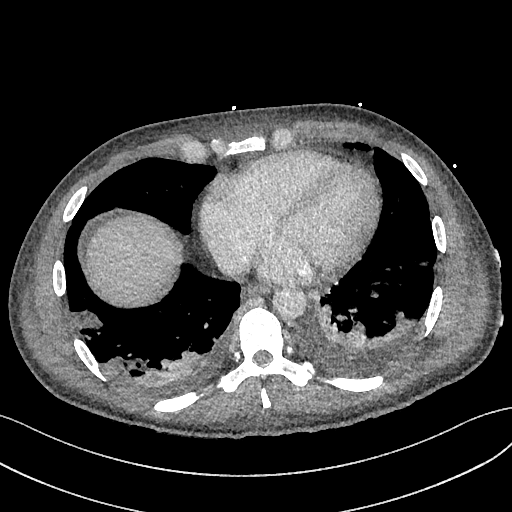

[Series 6: venous cor · coronal · portal-venous · 0.78mm/px · 3 of 146 slices shown]
[im 37/146  soft-tissue]
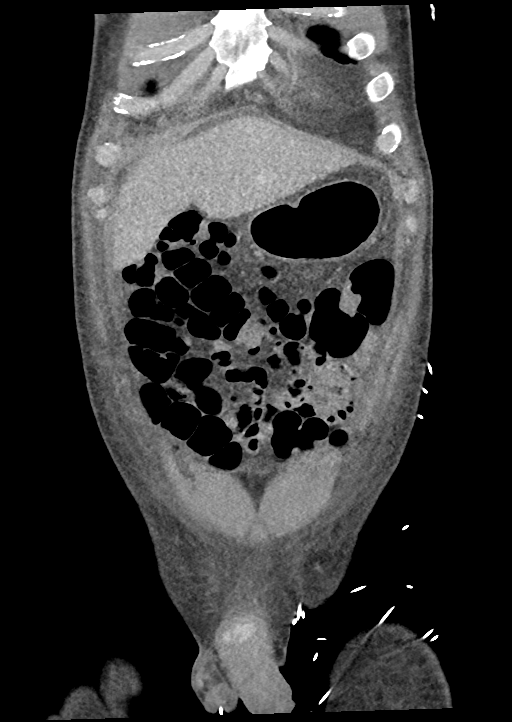
[im 73/146  soft-tissue]
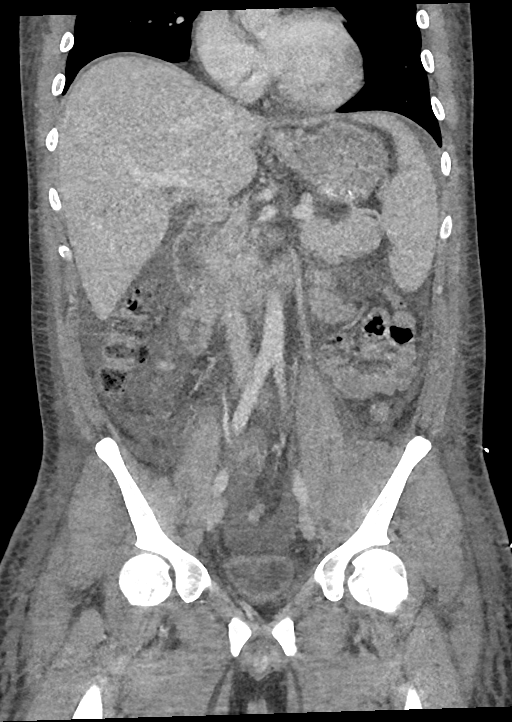
[im 109/146  soft-tissue]
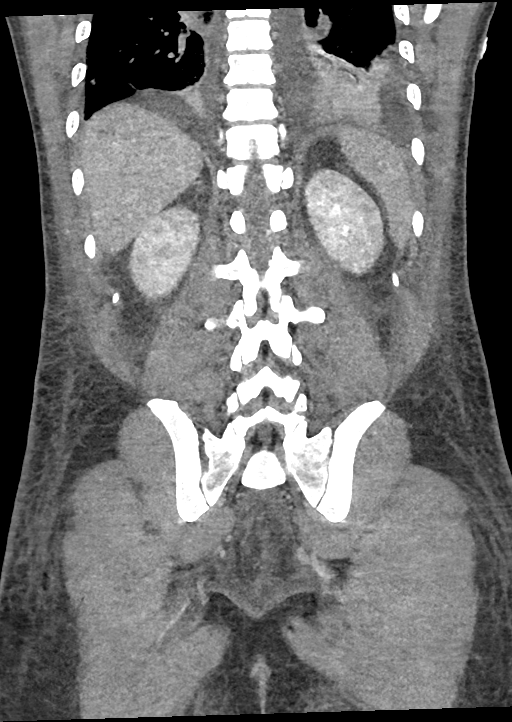

[14 of 46 positions shown; findings below may reference images not displayed]

FINDINGS: VASCULAR

Aorta: Unremarkable course, caliber, contour of the abdominal aorta.
No dissection, aneurysm, or periaortic fluid.

Celiac: Patent, with no significant atherosclerotic changes.

SMA: Patent, with no significant atherosclerotic changes.

Renals:

- Right: There appears to be single right renal artery which is
patent.

- Left: There appears to be single left renal artery which is
patent.

IMA: Inferior mesenteric artery is patent.

Right lower extremity:

Unremarkable course, caliber, and contour of the right iliac system.
No aneurysm, dissection, or occlusion. Hypogastric artery is patent.
Common femoral artery patent. Proximal SFA and profunda femoris
patent.

Left lower extremity:

Unremarkable course, caliber, and contour of the left iliac system.
No aneurysm, dissection, or occlusion. Hypogastric artery is patent.
Common femoral artery patent. Proximal SFA and profunda femoris
patent.

Veins:

Portal:

Portal branches are patent. The spleno portal confluence is patent.
The splenic vein is patent. The superior mesenteric vein is patent.

Systemic:

Unremarkable appearance of the bilateral common femoral veins and
iliac venous system. The IVC at the iliac confluence is patent.

The infrarenal IVC again demonstrates pedunculated thrombus at the
posteromedial IVC, which is unchanged from the comparison CT.
Additional thrombus at the level of the renal veins, nonocclusive.

Hepatic IVC is patent.

Review of the MIP images confirms the above findings.

NON-VASCULAR

Lower chest: Bilateral pleural effusions, new from the comparison.
Associated atelectasis.

Similar appearance of multiple nodular opacities of the visualized
lower lungs. The largest is at the right costophrenic sulcus with
small degree of internal cavitation. This lesion measures 19 mm.

Hepatobiliary: Unremarkable appearance of the liver. Gallbladder is
decompressed.

Pancreas: Unremarkable.

Spleen: Unremarkable.

Adrenals/Urinary Tract:

- Right adrenal gland: Unremarkable

- Left adrenal gland: Unremarkable.

- Right kidney: No hydronephrosis, nephrolithiasis, inflammation, or
ureteral dilation. No focal lesion.

- Left Kidney: No hydronephrosis, nephrolithiasis, inflammation, or
ureteral dilation. No focal lesion.

- Urinary Bladder: Unremarkable.

Stomach/Bowel:

- Stomach: Unremarkable.

- Small bowel: Unremarkable

- Appendix: Appendix is not visualized, however, no inflammatory
changes are present adjacent to the cecum to indicate an
appendicitis.

- Colon: Unremarkable.

Lymphatic: No adenopathy.

Mesenteric: Edema throughout the mesenteric fat. Small volume
low-density ascites within the abdomen. Edema of the body wall.

Reproductive: Unremarkable appearance of the pelvic organs.

Other: Surgical changes of the posterior paraspinal musculature,
compatible with the given history of recent neuro surgical
decompression.

Musculoskeletal: No acute displaced fracture. Surgical changes of
posterior paraspinal abscess decompression with gas in the soft
tissues.
IMPRESSION: Similar appearance of 2 sites of nonocclusive pedunculated septic
thrombus involving the infrarenal IVC, most likely secondary to
septic thrombophlebitis of the draining paraspinal venous plexus in
this patient with epidural abscess, status post neuro surgical
decompression.

Negative for portal vein thrombus.

Redemonstration of multifocal septic emboli of the visualized lower
lungs.

Body wall anasarca/edema and mesenteric edema with small volume
ascites. Small pleural effusions. Correlation with fluid status may
be useful.

## 2019-09-22 MED ORDER — ENSURE ENLIVE PO LIQD
237.0000 mL | Freq: Two times a day (BID) | ORAL | Status: DC
  Administered 2019-09-22 – 2019-10-07 (×19): 237 mL via ORAL
  Filled 2019-09-22: qty 237

## 2019-09-22 MED ORDER — HYDROMORPHONE HCL 1 MG/ML IJ SOLN
1.0000 mg | INTRAMUSCULAR | Status: DC | PRN
  Administered 2019-09-22 – 2019-10-05 (×73): 1 mg via INTRAVENOUS
  Filled 2019-09-22 (×75): qty 1

## 2019-09-22 MED ORDER — CYCLOBENZAPRINE HCL 10 MG PO TABS
5.0000 mg | ORAL_TABLET | Freq: Three times a day (TID) | ORAL | Status: DC | PRN
  Administered 2019-09-22 – 2019-09-28 (×11): 5 mg via ORAL
  Filled 2019-09-22 (×11): qty 1

## 2019-09-22 MED ORDER — POTASSIUM CHLORIDE CRYS ER 20 MEQ PO TBCR
40.0000 meq | EXTENDED_RELEASE_TABLET | Freq: Once | ORAL | Status: AC
  Administered 2019-09-22: 40 meq via ORAL
  Filled 2019-09-22: qty 2

## 2019-09-22 MED ORDER — IOHEXOL 350 MG/ML SOLN
100.0000 mL | Freq: Once | INTRAVENOUS | Status: AC | PRN
  Administered 2019-09-22: 100 mL via INTRAVENOUS

## 2019-09-22 NOTE — Plan of Care (Signed)
Poc progressing.  

## 2019-09-22 NOTE — Progress Notes (Signed)
CT called for patient, pt stated " I refuse to go in that machine right now, not until morning". CT notified, they said it will be done after 8am. Will continue to monitor.

## 2019-09-22 NOTE — Progress Notes (Signed)
Neurosurgery Service Progress Note  Subjective: No acute events overnight, back pain "dramatically" better post-op, no radicular Sx   Objective: Vitals:   09/21/19 1048 09/21/19 1833 09/21/19 1957 09/22/19 0005  BP: 129/76 134/74 137/83   Pulse: 87 69 74 83  Resp: 18 20 16 19   Temp: (!) 97.5 F (36.4 C) (!) 97.5 F (36.4 C) 98 F (36.7 C) 98.4 F (36.9 C)  TempSrc: Oral Oral Oral Oral  SpO2: 98% 100% 99% 100%  Weight:      Height:       Temp (24hrs), Avg:97.9 F (36.6 C), Min:97.5 F (36.4 C), Max:98.4 F (36.9 C)  CBC Latest Ref Rng & Units 09/21/2019 09/20/2019 09/19/2019  WBC 4.0 - 10.5 K/uL 17.6(H) 15.5(H) 18.5(H)  Hemoglobin 13.0 - 17.0 g/dL 10.6(L) 11.7(L) 12.1(L)  Hematocrit 39 - 52 % 30.1(L) 33.7(L) 34.6(L)  Platelets 150 - 400 K/uL 228 204 184   BMP Latest Ref Rng & Units 09/21/2019 09/20/2019 09/19/2019  Glucose 70 - 99 mg/dL 11/20/2019) 778(E) 88  BUN 6 - 20 mg/dL 10 16 10   Creatinine 0.61 - 1.24 mg/dL 423(N 3.61  Sodium 135 - 145 mmol/L 132(L) 132(L) 137  Potassium 3.5 - 5.1 mmol/L 3.2(L) 2.8(L) 4.1  Chloride 98 - 111 mmol/L 105 101 113(H)  CO2 22 - 32 mmol/L 20(L) 23 19(L)  Calcium 8.9 - 10.3 mg/dL 7.1(L) 7.3(L) 8.3(L)    Intake/Output Summary (Last 24 hours) at 09/22/2019 0708 Last data filed at 09/22/2019 0700 Gross per 24 hour  Intake 2554.42 ml  Output 1100 ml  Net 1454.42 ml    Current Facility-Administered Medications:  .  0.9 %  sodium chloride infusion, , Intravenous, Continuous, Regalado, Belkys A, MD, Last Rate: 100 mL/hr at 09/22/19 0700, New Bag at 09/22/19 0700 .  acetaminophen (TYLENOL) tablet 650 mg, 650 mg, Oral, Q6H PRN **OR** acetaminophen (TYLENOL) suppository 650 mg, 650 mg, Rectal, Q6H PRN, 11/23/19, MD .  Chlorhexidine Gluconate Cloth 2 % PADS 6 each, 6 each, Topical, Q0600, Regalado, Belkys A, MD, 6 each at 09/21/19 0852 .  HYDROcodone-acetaminophen (NORCO/VICODIN) 5-325 MG per tablet 1-2 tablet, 1-2 tablet, Oral, Q4H PRN, Andris Baumann, MD, 2 tablet at 09/22/19 0404 .  ketorolac (TORADOL) 30 MG/ML injection 30 mg, 30 mg, Intravenous, Q6H PRN, Andris Baumann, MD, 30 mg at 09/22/19 0308 .  lactated ringers infusion, , Intravenous, Continuous, Andris Baumann, MD .  morphine 2 MG/ML injection 2 mg, 2 mg, Intravenous, Q2H PRN, 11/23/19, MD, 2 mg at 09/22/19 0656 .  mupirocin ointment (BACTROBAN) 2 % 1 application, 1 application, Nasal, BID, Regalado, Belkys A, MD, 1 application at 09/21/19 2234 .  ondansetron (ZOFRAN) tablet 4 mg, 4 mg, Oral, Q6H PRN **OR** ondansetron (ZOFRAN) injection 4 mg, 4 mg, Intravenous, Q6H PRN, 11/22/19, MD .  vancomycin (VANCOCIN) IVPB 1000 mg/200 mL premix, 1,000 mg, Intravenous, Q8H, 2235, RPH, Last Rate: 200 mL/hr at 09/22/19 0101, 1,000 mg at 09/22/19 0101   Physical Exam: Strength 5/5x4, SILTx4, incision intact w/ dermabond  Assessment & Plan: 34 y.o. man s/p MIS lami for lumbar epidural abscess evac, Cx growing GPCs in clusters, recovering well.  -okay for SQH today, full dose anticoagulation tomorrow (7/5) -activity as tolerated, no brace needed  33  09/22/19 7:08 AM
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
## 2019-09-22 NOTE — Progress Notes (Signed)
Patient refused AM lab, he said his head hurts and light is bothering him. Told lab to come back an hour later. Will continue to monitor.

## 2019-09-22 NOTE — Progress Notes (Signed)
VASCULAR LAB   Bilateral lower extremity venous duplex completed.    Preliminary report:  See CV proc for preliminary results.  Lashona Schaaf, RVT 09/22/2019, 10:22 AM

## 2019-09-23 LAB — BASIC METABOLIC PANEL
Anion gap: 8 (ref 5–15)
BUN: 9 mg/dL (ref 6–20)
CO2: 24 mmol/L (ref 22–32)
Calcium: 7.7 mg/dL — ABNORMAL LOW (ref 8.9–10.3)
Chloride: 103 mmol/L (ref 98–111)
Creatinine, Ser: 1.09 mg/dL (ref 0.61–1.24)
GFR calc Af Amer: >60 mL/min (ref >60)
GFR calc non Af Amer: >60 mL/min (ref >60)
Glucose, Bld: 91 mg/dL (ref 70–99)
Potassium: 3.3 mmol/L — ABNORMAL LOW (ref 3.5–5.1)
Sodium: 135 mmol/L (ref 135–145)

## 2019-09-23 LAB — CBC
HCT: 32.7 % — ABNORMAL LOW (ref 39.0–52.0)
Hemoglobin: 11.3 g/dL — ABNORMAL LOW (ref 13.0–17.0)
MCH: 30.4 pg (ref 26.0–34.0)
MCHC: 34.6 g/dL (ref 30.0–36.0)
MCV: 87.9 fL (ref 80.0–100.0)
Platelets: 334 10*3/uL (ref 150–400)
RBC: 3.72 MIL/uL — ABNORMAL LOW (ref 4.22–5.81)
RDW: 13.2 % (ref 11.5–15.5)
WBC: 16.1 10*3/uL — ABNORMAL HIGH (ref 4.0–10.5)
nRBC: 0 % (ref 0.0–0.2)

## 2019-09-23 LAB — HEPARIN LEVEL (UNFRACTIONATED): Heparin Unfractionated: 0.7 IU/mL (ref 0.30–0.70)

## 2019-09-23 MED ORDER — POTASSIUM CHLORIDE CRYS ER 20 MEQ PO TBCR
40.0000 meq | EXTENDED_RELEASE_TABLET | Freq: Once | ORAL | Status: AC
  Administered 2019-09-23: 40 meq via ORAL
  Filled 2019-09-23: qty 2

## 2019-09-23 MED ORDER — HEPARIN (PORCINE) 25000 UT/250ML-% IV SOLN
2500.0000 [IU]/h | INTRAVENOUS | Status: DC
  Administered 2019-09-23 (×2): 2700 [IU]/h via INTRAVENOUS
  Administered 2019-09-24 – 2019-09-25 (×3): 2500 [IU]/h via INTRAVENOUS
  Filled 2019-09-23 (×5): qty 250

## 2019-09-23 NOTE — Progress Notes (Signed)
Subjective: Patient reports stable back pain  Objective: Vital signs in last 24 hours: Temp:  [97.6 F (36.4 C)-100.5 F (38.1 C)] 99 F (37.2 C) (07/05 0816) Pulse Rate:  [75-88] 75 (07/05 0816) Resp:  [19-24] 24 (07/05 0816) BP: (125-155)/(81-94) 125/84 (07/05 0816) SpO2:  [96 %-98 %] 98 % (07/05 0816)  Intake/Output from previous day: 07/04 0701 - 07/05 0700 In: 1690 [P.O.:700; IV Piggyback:990] Out: 2900 [Urine:2900] Intake/Output this shift: Total I/O In: -  Out: 1475 [Urine:1475]  Incision c/d 5/5 strength in LEs  Lab Results: Recent Labs    09/22/19 0720 09/23/19 0417  WBC 16.8* 16.1*  HGB 10.5* 11.3*  HCT 30.4* 32.7*  PLT 235 334   BMET Recent Labs    09/22/19 0720 09/23/19 0417  NA 133* 135  K 3.5 3.3*  CL 105 103  CO2 19* 24  GLUCOSE 122* 91  BUN 12 9  CREATININE 1.20 1.09  CALCIUM 7.3* 7.7*    Studies/Results: VAS [place] LOWER EXTREMITY VENOUS (DVT)  Result Date: 09/22/2019  Lower Venous DVTStudy Indications: Swelling.  Comparison Study: No prior study on file Performing Technologist: Sherren Kerns RVS  Examination Guidelines: A complete evaluation includes B-mode imaging, spectral Doppler, color Doppler, and power Doppler as needed of all accessible portions of each vessel. Bilateral testing is considered an integral part of a complete examination. Limited examinations for reoccurring indications may be performed as noted. The reflux portion of the exam is performed with the patient in reverse Trendelenburg.  +---------+---------------+---------+-----------+----------+--------------+ RIGHT    CompressibilityPhasicitySpontaneityPropertiesThrombus Aging +---------+---------------+---------+-----------+----------+--------------+ CFV      Full           Yes      Yes                                 +---------+---------------+---------+-----------+----------+--------------+ SFJ      Full                                                         +---------+---------------+---------+-----------+----------+--------------+ FV Prox  Full                                                        +---------+---------------+---------+-----------+----------+--------------+ FV Mid   Full                                                        +---------+---------------+---------+-----------+----------+--------------+ FV DistalFull                                                        +---------+---------------+---------+-----------+----------+--------------+ PFV      Full                                                        +---------+---------------+---------+-----------+----------+--------------+  POP      Full           Yes      Yes                                 +---------+---------------+---------+-----------+----------+--------------+ PTV      Full                                                        +---------+---------------+---------+-----------+----------+--------------+ PERO     Full                                                        +---------+---------------+---------+-----------+----------+--------------+   +---------+---------------+---------+-----------+----------+--------------+ LEFT     CompressibilityPhasicitySpontaneityPropertiesThrombus Aging +---------+---------------+---------+-----------+----------+--------------+ CFV      Full           Yes      Yes                                 +---------+---------------+---------+-----------+----------+--------------+ SFJ      Full                                                        +---------+---------------+---------+-----------+----------+--------------+ FV Prox  Full                                                        +---------+---------------+---------+-----------+----------+--------------+ FV Mid   Full                                                         +---------+---------------+---------+-----------+----------+--------------+ FV DistalFull                                                        +---------+---------------+---------+-----------+----------+--------------+ PFV      Full                                                        +---------+---------------+---------+-----------+----------+--------------+ POP      Full           Yes      Yes                                 +---------+---------------+---------+-----------+----------+--------------+  PTV      Full                                                        +---------+---------------+---------+-----------+----------+--------------+ PERO     Full                                                        +---------+---------------+---------+-----------+----------+--------------+     Summary: BILATERAL: - No evidence of deep vein thrombosis seen in the lower extremities, bilaterally. - RIGHT: - Ultrasound characteristics of enlarged lymph nodes are noted in the groin.  LEFT: - Ultrasound characteristics of enlarged lymph nodes noted in the groin.  *See table(s) above for measurements and observations. Electronically signed by Sherald Hess MD on 09/22/2019 at 1:17:51 PM.    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 Surgical decompression/evacuation 09/21/2019 EXAM: CTA ABDOMEN AND PELVIS WITHOUT AND WITH CONTRAST TECHNIQUE: Multidetector CT imaging of the abdomen and pelvis was performed using the standard protocol during bolus administration of intravenous contrast. Multiplanar reconstructed images and MIPs were obtained and reviewed to evaluate the vascular anatomy. CONTRAST:  OMNIPAQUE IOHEXOL 350 MG/ML SOLN COMPARISON:  09/17/2019, MR 09/20/2019 FINDINGS: VASCULAR Aorta: Unremarkable course,  caliber, contour of the abdominal aorta. No dissection, aneurysm, or periaortic fluid. Celiac: Patent, with no significant atherosclerotic changes. SMA: Patent, with no significant atherosclerotic changes. Renals: - Right: There appears to be single right renal artery which is patent. - Left: There appears to be single left renal artery which is patent. IMA: Inferior mesenteric artery is patent. Right lower extremity: Unremarkable course, caliber, and contour of the right iliac system. No aneurysm, dissection, or occlusion. Hypogastric artery is patent. Common femoral artery patent. Proximal SFA and profunda femoris patent. Left lower extremity: Unremarkable course, caliber, and contour of the left iliac system. No aneurysm, dissection, or occlusion. Hypogastric artery is patent. Common femoral artery patent. Proximal SFA and profunda femoris patent. Veins: Portal: Portal branches are patent. The spleno portal confluence is patent. The splenic vein is patent. The superior mesenteric vein is patent. Systemic: Unremarkable appearance of the bilateral common femoral veins and iliac venous system. The IVC at the iliac confluence is patent. The infrarenal IVC again demonstrates pedunculated thrombus at the posteromedial IVC, which is unchanged from the comparison CT. Additional thrombus at the level of the renal veins, nonocclusive. Hepatic IVC is patent. Review of the MIP images confirms the above findings. NON-VASCULAR Lower chest: Bilateral pleural effusions, new from the comparison. Associated atelectasis. Similar appearance of multiple nodular opacities of the visualized lower lungs. The largest is at the right costophrenic sulcus with small degree of internal cavitation. This lesion measures 19 mm. Hepatobiliary: Unremarkable appearance of the liver. Gallbladder is decompressed. Pancreas: Unremarkable. Spleen: Unremarkable. Adrenals/Urinary Tract: - Right adrenal gland: Unremarkable - Left adrenal gland:  Unremarkable. - Right kidney: No hydronephrosis, nephrolithiasis, inflammation, or ureteral dilation. No focal lesion. - Left Kidney: No hydronephrosis, nephrolithiasis, inflammation, or ureteral dilation. No focal lesion. - Urinary Bladder: Unremarkable. Stomach/Bowel: -  Stomach: Unremarkable. - Small bowel: Unremarkable - Appendix: Appendix is not visualized, however, no inflammatory changes are present adjacent to the cecum to indicate an appendicitis. - Colon: Unremarkable. Lymphatic: No adenopathy. Mesenteric: Edema throughout the mesenteric fat. Small volume low-density ascites within the abdomen. Edema of the body wall. Reproductive: Unremarkable appearance of the pelvic organs. Other: Surgical changes of the posterior paraspinal musculature, compatible with the given history of recent neuro surgical decompression. Musculoskeletal: No acute displaced fracture. Surgical changes of posterior paraspinal abscess decompression with gas in the soft tissues. IMPRESSION: Similar appearance of 2 sites of nonocclusive pedunculated septic thrombus involving the infrarenal IVC, most likely secondary to septic thrombophlebitis of the draining paraspinal venous plexus in this patient with epidural abscess, status post neuro surgical decompression. Negative for portal vein thrombus. Redemonstration of multifocal septic emboli of the visualized lower lungs. Body wall anasarca/edema and mesenteric edema with small volume ascites. Small pleural effusions. Correlation with fluid status may be useful. Signed, Yvone Neu. Reyne Dumas, RPVI [facility] Electronically Signed   By: Gilmer Mor D.O.   On: 09/22/2019 12:03    Assessment/Plan: S/p MIS lum lam for EDA - cont supportive care - on hep gtt for IVC thrombus   Scott Green 09/23/2019, 11:47 AM

## 2019-09-23 NOTE — Progress Notes (Signed)
ANTICOAGULATION CONSULT NOTE - Follow Up Consult  Pharmacy Consult for Heparin Indication: infra-renal IVC thrombus  Allergies  Allergen Reactions  . Tramadol Other (See Comments)    Stomach pain    Patient Measurements: Height: 5\' 7"  (170.2 cm) Weight: 81.7 kg (180 lb 1.9 oz) IBW/kg (Calculated) : 66.1 Heparin Dosing Weight:   Vital Signs: Temp: 99 F (37.2 C) (07/05 0816) Temp Source: Oral (07/05 0816) BP: 125/84 (07/05 0816) Pulse Rate: 75 (07/05 0816)  Labs: Recent Labs    09/20/19 1313 09/20/19 2006 09/21/19 0338 09/21/19 0338 09/22/19 0720 09/23/19 0417  HGB  --   --  10.6*   < > 10.5* 11.3*  HCT  --   --  30.1*  --  30.4* 32.7*  PLT  --   --  228  --  235 334  HEPARINUNFRC 0.39 0.33  --   --   --   --   CREATININE  --   --  1.06  --  1.20 1.09   < > = values in this interval not displayed.    Estimated Creatinine Clearance: 98.6 mL/min (by C-G formula based on SCr of 1.09 mg/dL).  Assessment: Anticoag: Heparin for infra-renal IVC thrombus; concern for septic pulmonary emboli. Held 7/3 per neurosurg x 48hrs. Resume IV heparin 7/5 (no bolus). Hgb 11.3 up today. Plts WNL.  Goal of Therapy:  Heparin level 0.3-0.7 units/ml Monitor platelets by anticoagulation protocol: Yes   Plan:  Resume IV heparin (no bolus) at previous 2700 units/hr Check heparin level in 6-8 hrs. Daily HL and CBC   Myrah Strawderman S. 9/5, PharmD, BCPS Clinical Staff Pharmacist [email or web address] Merilynn Finland, Latha Staunton Stillinger 09/23/2019,9:20 AM

## 2019-09-23 NOTE — Progress Notes (Signed)
Pharmacy Antibiotic Note  Scott Green is a 34 y.o. male admitted on 09/18/2019 with MRSA bacteremia, septic pulmonary emboli and L1-3 and maybe T2 vertebral infection + paraspinal muscles abscesses..  Pharmacy has been consulted for Vancomycin dosing.  ID: D6 abx for MRSA bacteremia, septic pulmonary emboli and L1-3 and maybe T2 vertebral infection + paraspinal muscles abscesses. - Tmax 100.5, WBC 16.1 down, Scr WNL. -  7/1 TTE negative for vegetations -  7/4: MIS Laminectomy and abscess drainage   - per [facility] chart, got Vanc 2gm 6/29 at 6pm then 2gm IV q12h (?) > last given 6/30 at 1740 Vancomycin 6/29>> Zosyn 6/29 >> 7/1  7/2: VT 1313 = 17 mcg/ml on 1 gm IV q8h - continue  6/29 COVID: negative (at [facility]) 6/29 Blood at [facility]: MRSA 7/1 blood x 2: NGTD 7/2: epidural abscess: SA 7/1 HIV: non-reactive 7/1: Hep C positive 7/1 HIV: non-reactive MRSA PCR pos  Plan: Con't Vanc 1g IV Q8h Repeat Vanco trough level later this week. Will f/u to possibly resume anticoagulation today    Height: 5\' 7"  (170.2 cm) Weight: 81.7 kg (180 lb 1.9 oz) IBW/kg (Calculated) : 66.1  Temp (24hrs), Avg:98.4 F (36.9 C), Min:97.4 F (36.3 C), Max:100.5 F (38.1 C)  Recent Labs  Lab 09/19/19 0239 09/20/19 0444 09/20/19 0737 09/20/19 1313 09/21/19 0338 09/22/19 0720 09/23/19 0417  WBC 18.5* 15.5*  --   --  17.6* 16.8* 16.1*  CREATININE 0.62  --  1.23  --  1.06 1.20 1.09  LATICACIDVEN 1.2  --   --   --   --   --   --   VANCOTROUGH  --   --   --  17  --   --   --     Estimated Creatinine Clearance: 98.6 mL/min (by C-G formula based on SCr of 1.09 mg/dL).    Allergies  Allergen Reactions  . Tramadol Other (See Comments)    Stomach pain   Leshaun Biebel S. 11/24/19, PharmD, BCPS Clinical Staff Pharmacist [email or web address]  Merilynn Finland 09/23/2019 7:51 AM

## 2019-09-23 NOTE — Progress Notes (Signed)
ANTICOAGULATION CONSULT NOTE - Follow Up Consult  Pharmacy Consult for Heparin Indication: Infrarenal IVC thrombus  Allergies  Allergen Reactions  . Tramadol Other (See Comments)    Stomach pain    Patient Measurements: Height: 5\' 7"  (170.2 cm) Weight: 81.7 kg (180 lb 1.9 oz) IBW/kg (Calculated) : 66.1 Heparin Dosing Weight: 81.7 kg  Vital Signs: Temp: 97.9 F (36.6 C) (07/05 1605) Temp Source: Oral (07/05 1605) BP: 137/81 (07/05 1149) Pulse Rate: 93 (07/05 1149)  Labs: Recent Labs    09/20/19 2006 09/21/19 0338 09/21/19 0338 09/22/19 0720 09/23/19 0417 09/23/19 1628  HGB  --  10.6*   < > 10.5* 11.3*  --   HCT  --  30.1*  --  30.4* 32.7*  --   PLT  --  228  --  235 334  --   HEPARINUNFRC 0.33  --   --   --   --  0.70  CREATININE  --  1.06  --  1.20 1.09  --    < > = values in this interval not displayed.    Estimated Creatinine Clearance: 98.6 mL/min (by C-G formula based on SCr of 1.09 mg/dL).  Assessment: 34 yr old male on heparin infusion for infrarenal IVC thrombus, with concern for septic pulmonary emboli (heparin was held 7/3 X 48 hrs, per Neurosurgery).  Pharmacy was consulted to resume IV heparin on 7/5 (no bolus). Hgb 11.3 up today, platelets WNL (CBC stable).  7/4 Vascular U/S lower extremity: negative 7/4 CTA (abd): 2 sites of nonocclusive septic thrombus involving the infrarenal IVC, most likely secondary to septic thrombophlebitis of the draining paraspinal venous plexus in this patient with epidural abscess  Heparin level ~7 hrs after restarting heparin infusion at 2700 units/hr was 0.70 units/ml, which is at the upper end of the goal range for this pt. Per RN, no issues with IV or bleeding observed.  Goal of Therapy:  Heparin level 0.3-0.7 units/ml Monitor platelets by anticoagulation protocol: Yes   Plan:  Continue heparin infusion at 2700 units/hr Check confirmatory heparin level in 6 hrs Monitor daily heparin level, CBC Monitor for  signs/symptoms of bleeding  9/4, PharmD, BCPS, [facility] Clinical Pharmacist 09/23/2019,5:17 PM
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
## 2019-09-24 ENCOUNTER — Inpatient Hospital Stay (HOSPITAL_COMMUNITY): Payer: Medicaid Other | Admitting: Certified Registered Nurse Anesthetist

## 2019-09-24 ENCOUNTER — Encounter (HOSPITAL_COMMUNITY): Payer: Self-pay | Admitting: Internal Medicine

## 2019-09-24 ENCOUNTER — Inpatient Hospital Stay (HOSPITAL_COMMUNITY): Payer: Medicaid Other

## 2019-09-24 ENCOUNTER — Encounter (HOSPITAL_COMMUNITY)
Admission: EM | Disposition: A | Discharge status: Home / Self Care | Payer: Self-pay | Source: Other Acute Inpatient Hospital | Attending: Internal Medicine

## 2019-09-24 DIAGNOSIS — I360 Nonrheumatic tricuspid (valve) stenosis: Secondary | ICD-10-CM

## 2019-09-24 DIAGNOSIS — R7881 Bacteremia: Secondary | ICD-10-CM

## 2019-09-24 DIAGNOSIS — I368 Other nonrheumatic tricuspid valve disorders: Secondary | ICD-10-CM

## 2019-09-24 DIAGNOSIS — I82221 Chronic embolism and thrombosis of inferior vena cava: Secondary | ICD-10-CM

## 2019-09-24 DIAGNOSIS — I339 Acute and subacute endocarditis, unspecified: Secondary | ICD-10-CM

## 2019-09-24 DIAGNOSIS — M545 Low back pain: Secondary | ICD-10-CM

## 2019-09-24 DIAGNOSIS — A4102 Sepsis due to Methicillin resistant Staphylococcus aureus: Secondary | ICD-10-CM

## 2019-09-24 DIAGNOSIS — I079 Rheumatic tricuspid valve disease, unspecified: Secondary | ICD-10-CM

## 2019-09-24 DIAGNOSIS — M6008 Infective myositis, other site: Secondary | ICD-10-CM

## 2019-09-24 HISTORY — PX: TEE WITHOUT CARDIOVERSION: SHX5443

## 2019-09-24 LAB — BASIC METABOLIC PANEL
Anion gap: 9 (ref 5–15)
BUN: 9 mg/dL (ref 6–20)
CO2: 26 mmol/L (ref 22–32)
Calcium: 7.9 mg/dL — ABNORMAL LOW (ref 8.9–10.3)
Chloride: 97 mmol/L — ABNORMAL LOW (ref 98–111)
Creatinine, Ser: 1.05 mg/dL (ref 0.61–1.24)
GFR calc Af Amer: >60 mL/min (ref >60)
GFR calc non Af Amer: >60 mL/min (ref >60)
Glucose, Bld: 110 mg/dL — ABNORMAL HIGH (ref 70–99)
Potassium: 4 mmol/L (ref 3.5–5.1)
Sodium: 132 mmol/L — ABNORMAL LOW (ref 135–145)

## 2019-09-24 LAB — CULTURE, BLOOD (ROUTINE X 2)
Culture: NO GROWTH
Culture: NO GROWTH
Special Requests: ADEQUATE
Special Requests: ADEQUATE

## 2019-09-24 LAB — CBC
HCT: 31.1 % — ABNORMAL LOW (ref 39.0–52.0)
Hemoglobin: 10.7 g/dL — ABNORMAL LOW (ref 13.0–17.0)
MCH: 30.1 pg (ref 26.0–34.0)
MCHC: 34.4 g/dL (ref 30.0–36.0)
MCV: 87.4 fL (ref 80.0–100.0)
Platelets: 380 10*3/uL (ref 150–400)
RBC: 3.56 MIL/uL — ABNORMAL LOW (ref 4.22–5.81)
RDW: 13.2 % (ref 11.5–15.5)
WBC: 18 10*3/uL — ABNORMAL HIGH (ref 4.0–10.5)
nRBC: 0 % (ref 0.0–0.2)

## 2019-09-24 LAB — HEPARIN LEVEL (UNFRACTIONATED)
Heparin Unfractionated: 0.74 IU/mL — ABNORMAL HIGH (ref 0.30–0.70)
Heparin Unfractionated: 0.67 IU/mL (ref 0.30–0.70)

## 2019-09-24 LAB — MAGNESIUM: Magnesium: 1.6 mg/dL — ABNORMAL LOW (ref 1.7–2.4)

## 2019-09-24 IMAGING — CT CT ABD-PELV W/O CM
2 of 4 series · 16 of 46 positions shown, 18 images · non-contrast
Comparison: [DATE].

CLINICAL DATA: Acute right flank pain.

EXAM:
CT ABDOMEN AND PELVIS WITHOUT CONTRAST
TECHNIQUE: Multidetector CT imaging of the abdomen and pelvis was performed
following the standard protocol without IV contrast.

[Series 3: ap without · axial · non-contrast · 0.73mm/px · z∈[-648,-198]mm · 13 of 102 slices shown, 15 images]
[im 6/102  soft-tissue]
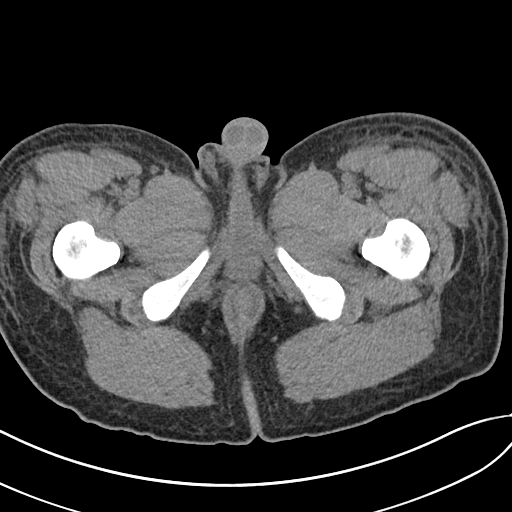
[im 6/102  bone]
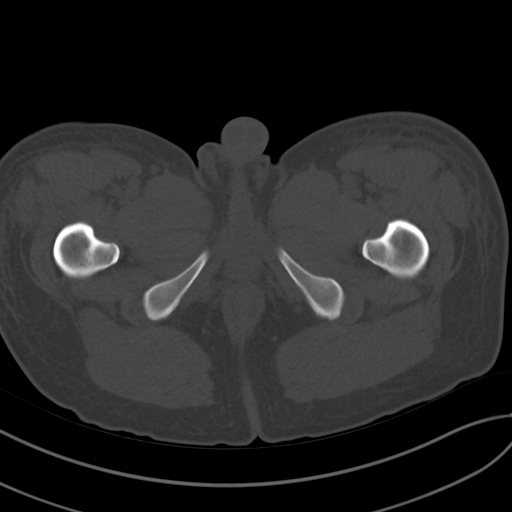
[im 16/102  soft-tissue]
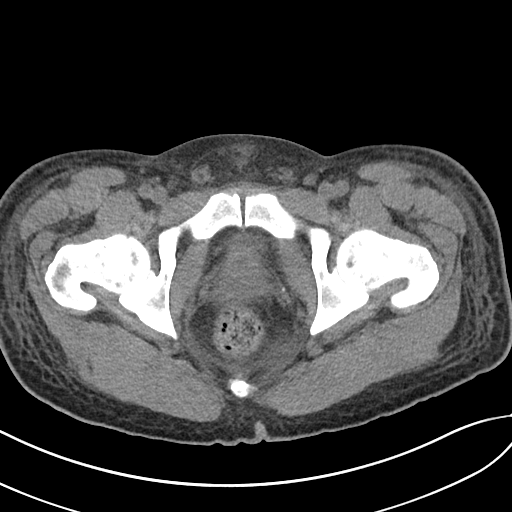
[im 21/102  soft-tissue]
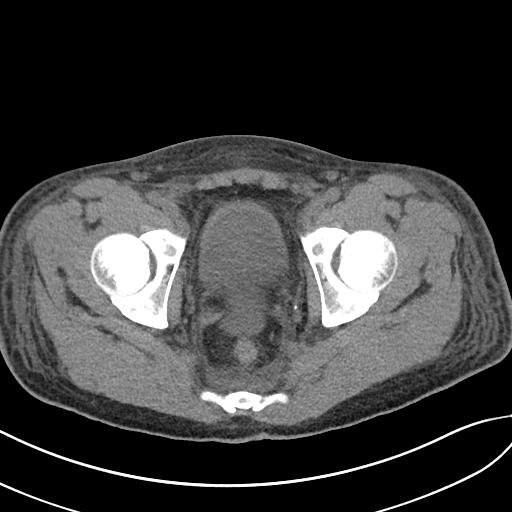
[im 31/102  soft-tissue]
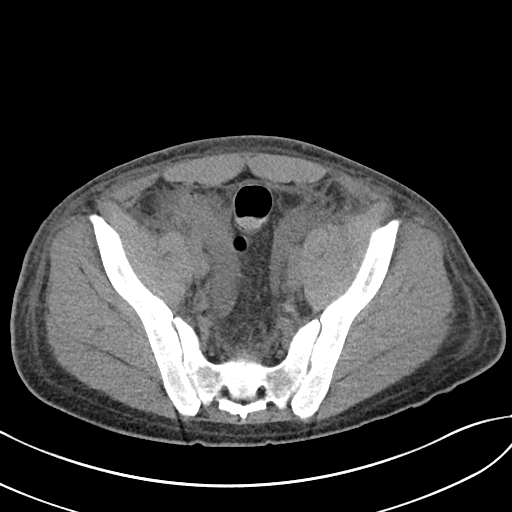
[im 36/102  soft-tissue]
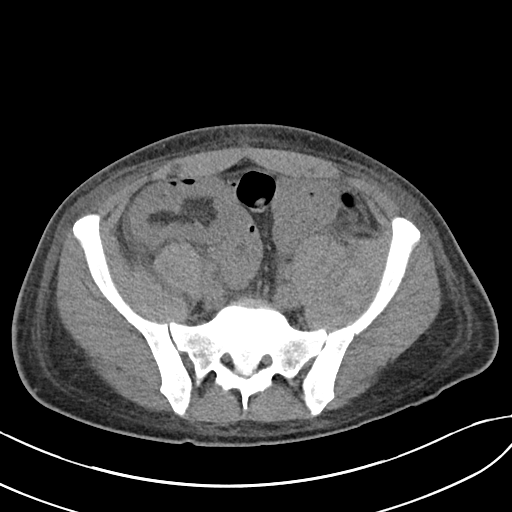
[im 46/102  soft-tissue]
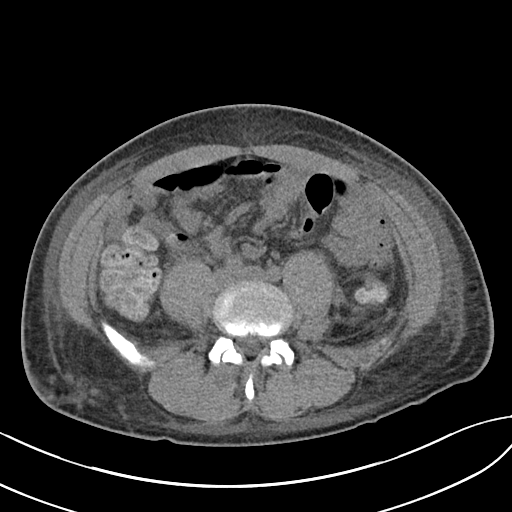
[im 51/102  soft-tissue]
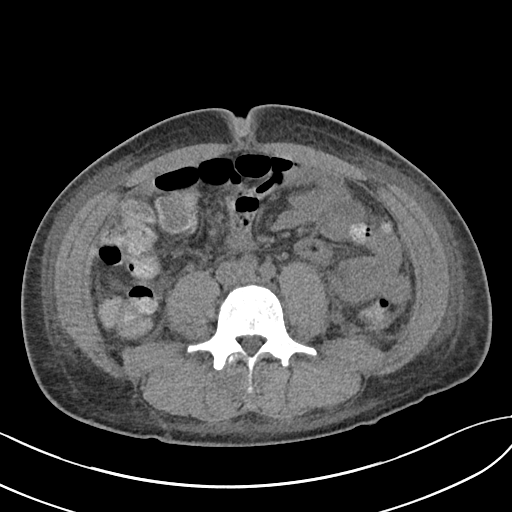
[im 56/102  soft-tissue]
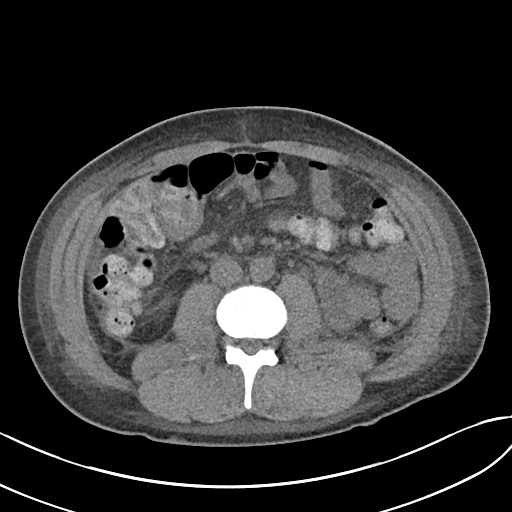
[im 66/102  soft-tissue]
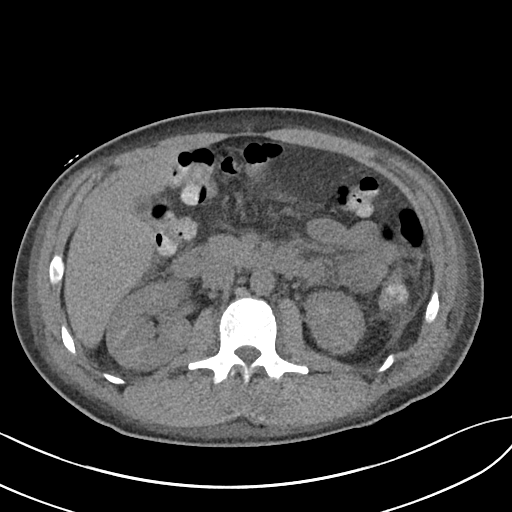
[im 66/102  bone]
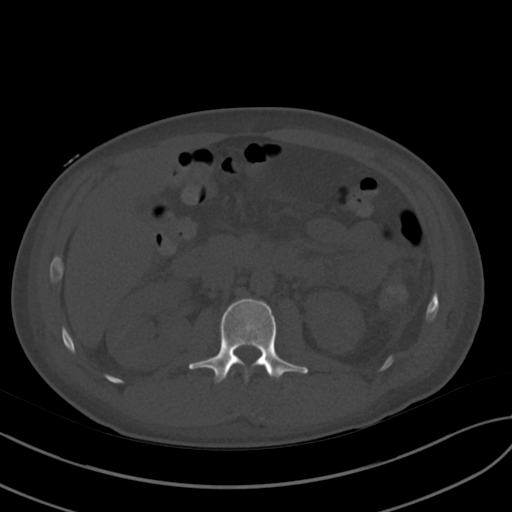
[im 71/102  soft-tissue]
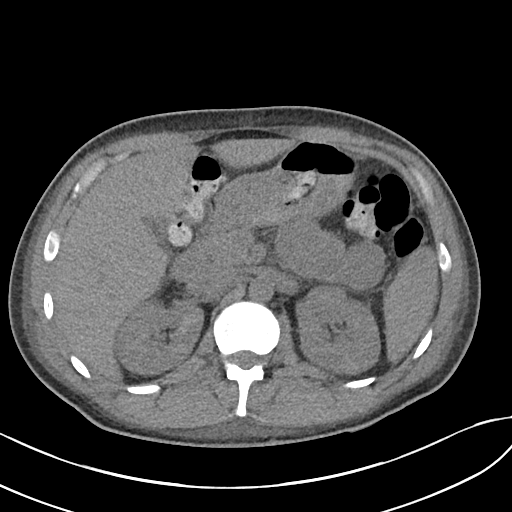
[im 81/102  soft-tissue]
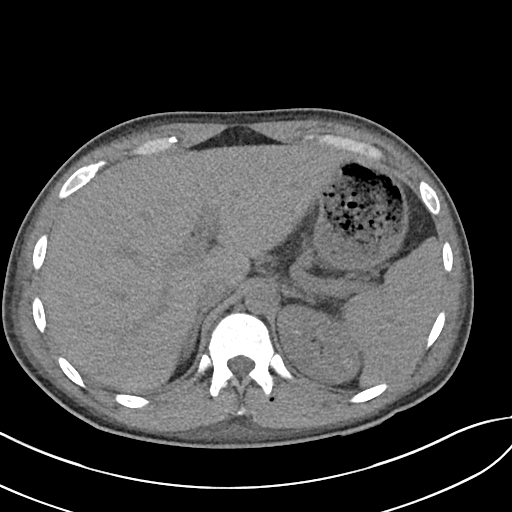
[im 86/102  soft-tissue]
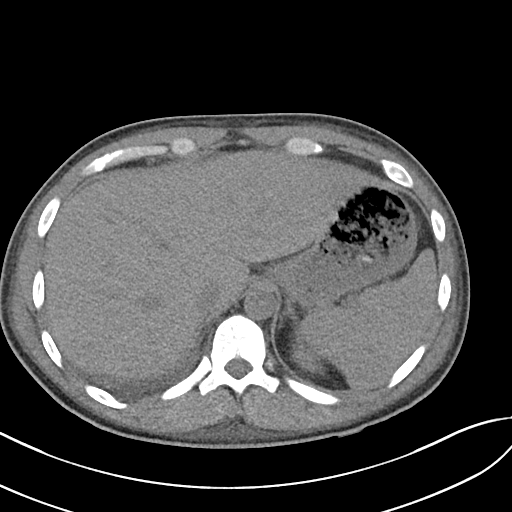
[im 96/102  soft-tissue]
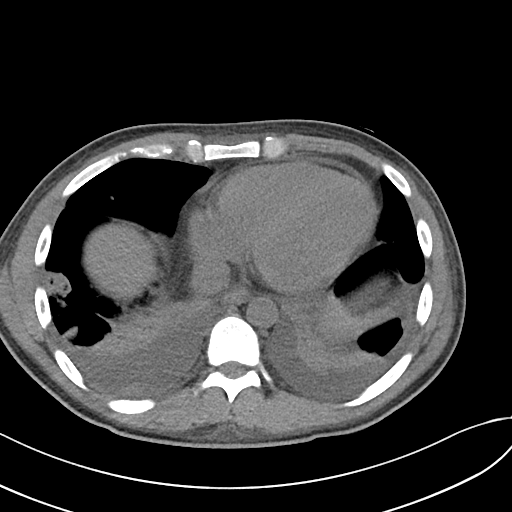

[Series 6: cor · coronal · 0.80mm/px · 3 of 100 slices shown]
[im 34/100  soft-tissue]
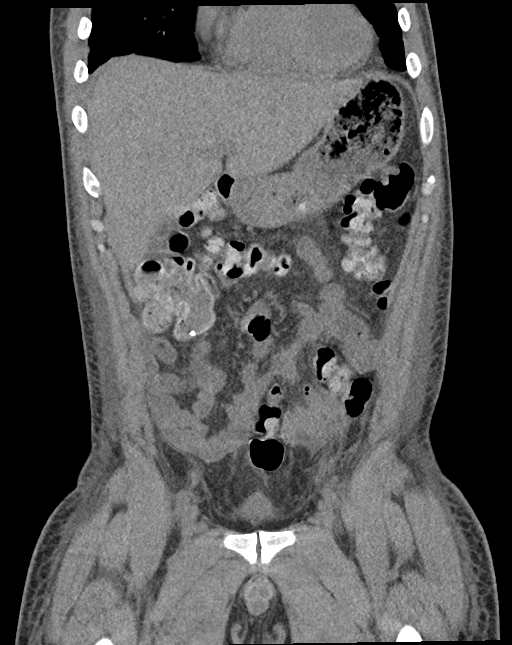
[im 45/100  soft-tissue]
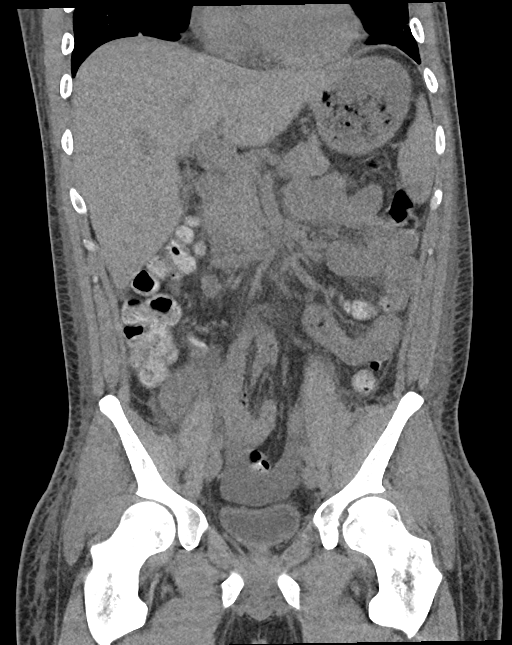
[im 56/100  soft-tissue]
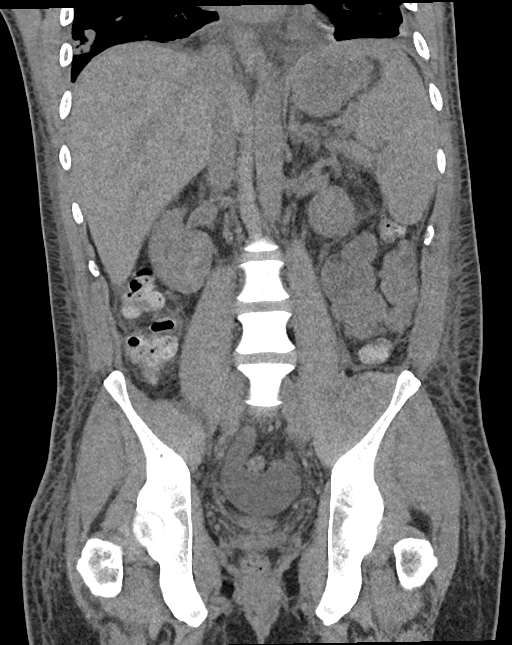

[16 of 46 positions shown; findings below may reference images not displayed]

FINDINGS: Lower chest: Moderate bilateral pleural effusions are noted with
adjacent subsegmental atelectasis. Stable findings consistent with
bilateral septic pulmonary emboli within visualized lung bases as
noted on prior exam.

Hepatobiliary: No focal liver abnormality is seen. No gallstones,
gallbladder wall thickening, or biliary dilatation.

Pancreas: Unremarkable. No pancreatic ductal dilatation or
surrounding inflammatory changes.

Spleen: Normal in size without focal abnormality.

Adrenals/Urinary Tract: Adrenal glands are unremarkable. Kidneys are
normal, without renal calculi, focal lesion, or hydronephrosis.
Bladder is unremarkable.

Stomach/Bowel: Stomach is within normal limits. Appendix appears
normal. No evidence of bowel wall thickening, distention, or
inflammatory changes.

Vascular/Lymphatic: No significant vascular findings are present. No
enlarged abdominal or pelvic lymph nodes.

Reproductive: Prostate is unremarkable.

Other: Minimal ascites is noted in the pelvis. No definite hernia is
noted. Minimal anasarca is noted.

Musculoskeletal: No acute or significant osseous findings.
IMPRESSION: 1. Moderate bilateral pleural effusions are noted with adjacent
subsegmental atelectasis.
2. Stable findings consistent with bilateral septic pulmonary emboli
within visualized lung bases as noted on prior exam.
3. Minimal ascites is noted in the pelvis. Minimal anasarca is
noted.
4. No other abnormality seen in the abdomen or pelvis.

## 2019-09-24 SURGERY — ECHOCARDIOGRAM, TRANSESOPHAGEAL
Anesthesia: Monitor Anesthesia Care

## 2019-09-24 MED ORDER — PANTOPRAZOLE SODIUM 40 MG IV SOLR
40.0000 mg | Freq: Once | INTRAVENOUS | Status: AC
  Administered 2019-09-24: 40 mg via INTRAVENOUS
  Filled 2019-09-24: qty 40

## 2019-09-24 MED ORDER — SODIUM CHLORIDE 0.9 % IV SOLN: INTRAVENOUS | Status: DC | PRN

## 2019-09-24 MED ORDER — PROPOFOL 10 MG/ML IV BOLUS
INTRAVENOUS | Status: DC | PRN
  Administered 2019-09-24: 10 mg via INTRAVENOUS
  Administered 2019-09-24 (×2): 20 mg via INTRAVENOUS
  Administered 2019-09-24 (×2): 10 mg via INTRAVENOUS
  Administered 2019-09-24 (×2): 20 mg via INTRAVENOUS

## 2019-09-24 MED ORDER — PROPOFOL 500 MG/50ML IV EMUL
INTRAVENOUS | Status: DC | PRN
  Administered 2019-09-24: 200 ug/kg/min via INTRAVENOUS
  Administered 2019-09-24: 125 ug/kg/min via INTRAVENOUS

## 2019-09-24 MED ORDER — FUROSEMIDE 10 MG/ML IJ SOLN
20.0000 mg | Freq: Two times a day (BID) | INTRAMUSCULAR | Status: AC
  Administered 2019-09-24 – 2019-09-25 (×2): 20 mg via INTRAVENOUS
  Filled 2019-09-24 (×2): qty 2

## 2019-09-24 MED ORDER — MAGNESIUM SULFATE 2 GM/50ML IV SOLN
2.0000 g | Freq: Once | INTRAVENOUS | Status: AC
  Administered 2019-09-24: 2 g via INTRAVENOUS
  Filled 2019-09-24 (×2): qty 50

## 2019-09-24 MED ORDER — FENTANYL CITRATE (PF) 100 MCG/2ML IJ SOLN
INTRAMUSCULAR | Status: DC | PRN
  Administered 2019-09-24: 100 ug via INTRAVENOUS

## 2019-09-24 MED ORDER — BUTAMBEN-TETRACAINE-BENZOCAINE 2-2-14 % EX AERO
INHALATION_SPRAY | CUTANEOUS | Status: DC | PRN
  Administered 2019-09-24: 2 via TOPICAL

## 2019-09-24 MED ORDER — PHENYLEPHRINE 40 MCG/ML (10ML) SYRINGE FOR IV PUSH (FOR BLOOD PRESSURE SUPPORT)
PREFILLED_SYRINGE | INTRAVENOUS | Status: DC | PRN
  Administered 2019-09-24: 80 ug via INTRAVENOUS

## 2019-09-24 MED ORDER — SODIUM CHLORIDE 0.9 % IV SOLN: INTRAVENOUS | Status: DC

## 2019-09-24 MED ORDER — KETOROLAC TROMETHAMINE 30 MG/ML IJ SOLN
30.0000 mg | Freq: Once | INTRAMUSCULAR | Status: AC
  Administered 2019-09-24: 30 mg via INTRAVENOUS
  Filled 2019-09-24: qty 1

## 2019-09-24 MED ORDER — FENTANYL CITRATE (PF) 100 MCG/2ML IJ SOLN
INTRAMUSCULAR | Status: AC
  Filled 2019-09-24: qty 2

## 2019-09-24 NOTE — Transfer of Care (Signed)
Immediate Anesthesia Transfer of Care Note  Patient: Scott [name]  Procedure(s) Performed: TRANSESOPHAGEAL ECHOCARDIOGRAM (TEE) (N/A )  Patient Location: Endoscopy Unit  Anesthesia Type:MAC  Level of Consciousness: awake, alert  and oriented  Airway & Oxygen Therapy: Patient Spontanous Breathing  Post-op Assessment: Report given to RN, Post -op Vital signs reviewed and stable and Patient moving all extremities X 4  Post vital signs: Reviewed and stable  Last Vitals:  Vitals Value Taken Time  BP 207/179 09/24/19 0855  Temp    Pulse 96 09/24/19 0856  Resp 29 09/24/19 0856  SpO2 97 % 09/24/19 0856  Vitals shown include unvalidated device data.  Last Pain:  Vitals:   09/24/19 0718  TempSrc: Oral  PainSc: 9       Patients Stated Pain Goal: 2 (73/22/02 5427)  Complications: No complications documented.

## 2019-09-24 NOTE — Anesthesia Postprocedure Evaluation (Signed)
Anesthesia Post Note  Patient: Scott [name]  Procedure(s) Performed: TRANSESOPHAGEAL ECHOCARDIOGRAM (TEE) (N/A )     Patient location during evaluation: Endoscopy Anesthesia Type: MAC Level of consciousness: awake and alert Pain management: pain level controlled Vital Signs Assessment: post-procedure vital signs reviewed and stable Respiratory status: spontaneous breathing, nonlabored ventilation, respiratory function stable and patient connected to nasal cannula oxygen Cardiovascular status: blood pressure returned to baseline and stable Postop Assessment: no apparent nausea or vomiting Anesthetic complications: no   No complications documented.  Last Vitals:  Vitals:   09/24/19 0904 09/24/19 0914  BP: (!) 144/83 (!) 164/80  Pulse: 83 87  Resp: (!) 25 (!) 29  Temp:    SpO2: 98% 98%    Last Pain:  Vitals:   09/24/19 0914  TempSrc:   PainSc: 9                  Orvile Corona L Earley Grobe

## 2019-09-24 NOTE — CV Procedure (Signed)
    TRANSESOPHAGEAL ECHOCARDIOGRAM   NAME:  Scott [name]   MRN: [id number] DOB:  03/03/1986   ADMIT DATE: 09/18/2019  INDICATIONS: MRSA epidural abscess  PROCEDURE:   Informed consent was obtained prior to the procedure. The risks, benefits and alternatives for the procedure were discussed and the patient comprehended these risks.  Risks include, but are not limited to, cough, sore throat, vomiting, nausea, somnolence, esophageal and stomach trauma or perforation, bleeding, low blood pressure, aspiration, pneumonia, infection, trauma to the teeth and death.    Procedural time out performed. The oropharynx was anesthetized with topical 1% benzocaine.    During this procedure the patient is administered a total of 666 mg of propofol and 100 mcg of fentanyl. Monitored anesthesia care provided under the supervision of Dr. Armond Hang.  The transesophageal probe was inserted in the esophagus and stomach without difficulty and multiple views were obtained.    COMPLICATIONS:    There were no immediate complications.  FINDINGS:  LEFT VENTRICLE: EF = 60%. No regional wall motion abnormalities.  RIGHT VENTRICLE: Normal size and function.   LEFT ATRIUM: No thrombus/mass.  LEFT ATRIAL APPENDAGE: No thrombus/mass.   RIGHT ATRIUM: No thrombus/mass.  AORTIC VALVE:  Trileaflet. No significant regurgitation. No vegetation.  MITRAL VALVE:    Normal structure. Trivial regurgitation. There is thickening of a portion of the anterior mitral valve leaflet without independent motion of the structure.   TRICUSPID VALVE: Normal structure. Trivial regurgitation. There is thickened and a mobile echodensity, seen clearly on 3D imaging, consistent with tricuspid valve endocarditis.  PULMONIC VALVE: Grossly normal structure. Trivial regurgitation. No apparent vegetation.  INTERATRIAL SEPTUM: No PFO or ASD seen by color Doppler.  PERICARDIUM: No effusion noted.  DESCENDING AORTA: No significant plaque  seen   CONCLUSION: Imaging consistent with tricuspid valve endocarditis.  Jodelle Red, MD, PhD [facility]  [address] [place], [place] [address] [phone]   9:02 AM

## 2019-09-24 NOTE — Progress Notes (Signed)
OT Cancellation Note  Patient Details Name: Scott [name] MRN: [id number] DOB: 1985/11/22   Cancelled Treatment:    Reason Eval/Treat Not Completed: Patient at procedure or test/ unavailable , will follow up for OT eval as able.  Marcy Siren, OT Acute Rehabilitation Services Pager [phone] Office [phone]  Orlando Penner 09/24/2019, 7:16 AM

## 2019-09-24 NOTE — Anesthesia Preprocedure Evaluation (Addendum)
Anesthesia Evaluation  Patient identified by MRN, date of birth, ID band Patient awake    Reviewed: Allergy & Precautions, NPO status , Patient's Chart, lab work & pertinent test results  Airway Mallampati: III  TM Distance: >3 FB Neck ROM: Full    Dental no notable dental hx. (+) Teeth Intact, Dental Advisory Given   Pulmonary neg pulmonary ROS, former smoker,  Septic pulmonary emboli   Pulmonary exam normal breath sounds clear to auscultation       Cardiovascular Normal cardiovascular exam Rhythm:Regular Rate:Normal  IVC thrombus on heparin gtt   Neuro/Psych negative neurological ROS  negative psych ROS   GI/Hepatic negative GI ROS, (+)     substance abuse (oxycontin 72m TID)  alcohol use, marijuana use, methamphetamine use and IV drug use, Hepatitis -, C  Endo/Other  negative endocrine ROS  Renal/GU negative Renal ROS  negative genitourinary   Musculoskeletal  (+) narcotic dependent  Abdominal   Peds  Hematology negative hematology ROS (+)   Anesthesia Other Findings Admitted on 09/18/2019 with MRSA bacteremia, septic pulmonary emboli and L1-3 and maybe T2 vertebral infection + paraspinal muscles abscesses. Now presents for TEE.   Reproductive/Obstetrics                          Anesthesia Physical Anesthesia Plan  ASA: III  Anesthesia Plan: MAC   Post-op Pain Management:    Induction: Intravenous  PONV Risk Score and Plan: 1 and Propofol infusion and Treatment may vary due to age or medical condition  Airway Management Planned: Natural Airway  Additional Equipment:   Intra-op Plan:   Post-operative Plan:   Informed Consent: I have reviewed the patients History and Physical, chart, labs and discussed the procedure including the risks, benefits and alternatives for the proposed anesthesia with the patient or authorized representative who has indicated his/her understanding and  acceptance.     Dental advisory given  Plan Discussed with: CRNA  Anesthesia Plan Comments:        Anesthesia Quick Evaluation

## 2019-09-24 NOTE — Progress Notes (Signed)
ANTICOAGULATION CONSULT NOTE - Follow Up Consult  Pharmacy Consult for Heparin  Indication:  Infrarenal IVC thrombus  Allergies  Allergen Reactions  . Tramadol Other (See Comments)    Stomach pain    Patient Measurements: Height: 5\' 7"  (170.2 cm) Weight: 81.7 kg (180 lb 1.9 oz) IBW/kg (Calculated) : 66.1 Heparin Dosing Weight:   Vital Signs: Temp: 98.3 F (36.8 C) (07/06 0948) Temp Source: Oral (07/06 0948) BP: 164/80 (07/06 0914) Pulse Rate: 89 (07/06 0948)  Labs: Recent Labs    09/22/19 0720 09/22/19 0720 09/23/19 0417 09/23/19 1628 09/24/19 0105 09/24/19 0957  HGB 10.5*   < > 11.3*  --  10.7*  --   HCT 30.4*  --  32.7*  --  31.1*  --   PLT 235  --  334  --  380  --   HEPARINUNFRC  --   --   --  0.70 0.74* 0.67  CREATININE 1.20  --  1.09  --  1.05  --    < > = values in this interval not displayed.    Estimated Creatinine Clearance: 102.3 mL/min (by C-G formula based on SCr of 1.05 mg/dL).    Assessment:  Anticoag: Heparin for infra-renal IVC thrombus; concern for septic pulmonary emboli. Held 7/3 per neurosurg x 48hrs. Resume IV heparin 7/5 (no bolus).  - HL 0.67 now in goal range. Hgb 10.7 relatively stable. Plts 380 rising.  Goal of Therapy:  Heparin level 0.3-0.7 units/ml Monitor platelets by anticoagulation protocol: Yes   Plan:  IV heparin 2500 units/hr Daily HL and CBC   Kamari Buch S. 9/5, PharmD, BCPS Clinical Staff Pharmacist [email or web address] Merilynn Finland, Hasten Sweitzer Stillinger 09/24/2019,11:24 AM

## 2019-09-24 NOTE — Anesthesia Procedure Notes (Signed)
Procedure Name: MAC Date/Time: 09/24/2019 8:10 AM Performed by: Harden Mo, CRNA Pre-anesthesia Checklist: Patient identified, Emergency Drugs available, Suction available and Patient being monitored Patient Re-evaluated:Patient Re-evaluated prior to induction Oxygen Delivery Method: Nasal cannula Preoxygenation: Pre-oxygenation with 100% oxygen Induction Type: IV induction Placement Confirmation: positive ETCO2 and breath sounds checked- equal and bilateral Dental Injury: Teeth and Oropharynx as per pre-operative assessment
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
## 2019-09-24 NOTE — Progress Notes (Signed)
Neurosurgery Service Progress Note  Subjective: No acute events overnight, no radicular Sx  Objective: Vitals:   09/24/19 0904 09/24/19 0914 09/24/19 0948 09/24/19 1100  BP: (!) 144/83 (!) 164/80  134/83  Pulse: 83 87 89 (!) 103  Resp: (!) 25 (!) 29 20 16   Temp:   98.3 F (36.8 C) (!) 97.4 F (36.3 C)  TempSrc:   Oral Oral  SpO2: 98% 98% 97% 95%  Weight:      Height:       Temp (24hrs), Avg:98.3 F (36.8 C), Min:97.4 F (36.3 C), Max:99.1 F (37.3 C)  CBC Latest Ref Rng & Units 09/24/2019 09/23/2019 09/22/2019  WBC 4.0 - 10.5 K/uL 18.0(H) 16.1(H) 16.8(H)  Hemoglobin 13.0 - 17.0 g/dL 10.7(L) 11.3(L) 10.5(L)  Hematocrit 39 - 52 % 31.1(L) 32.7(L) 30.4(L)  Platelets 150 - 400 K/uL 380 334 235   BMP Latest Ref Rng & Units 09/24/2019 09/23/2019 09/22/2019  Glucose 70 - 99 mg/dL 11/23/2019) 91 300(T)  BUN 6 - 20 mg/dL 9 9 12   Creatinine 0.61 - 1.24 mg/dL 622(Q 3.33  Sodium 135 - 145 mmol/L 132(L) 135 133(L)  Potassium 3.5 - 5.1 mmol/L 4.0 3.3(L) 3.5  Chloride 98 - 111 mmol/L 97(L) 103 105  CO2 22 - 32 mmol/L 26 24 19(L)  Calcium 8.9 - 10.3 mg/dL 7.9(L) 7.7(L) 7.3(L)    Intake/Output Summary (Last 24 hours) at 09/24/2019 1552 Last data filed at 09/24/2019 0552 Gross per 24 hour  Intake --  Output 5075 ml  Net -5075 ml    Current Facility-Administered Medications:  .  acetaminophen (TYLENOL) tablet 650 mg, 650 mg, Oral, Q6H PRN **OR** acetaminophen (TYLENOL) suppository 650 mg, 650 mg, Rectal, Q6H PRN, 11/25/2019, MD .  Chlorhexidine Gluconate Cloth 2 % PADS 6 each, 6 each, Topical, Q0600, 11/25/2019, MD, 6 each at 09/24/19 0550 .  cyclobenzaprine (FLEXERIL) tablet 5 mg, 5 mg, Oral, TID PRN, Jodelle Red, MD, 5 mg at 09/24/19 1202 .  feeding supplement (ENSURE ENLIVE) (ENSURE ENLIVE) liquid 237 mL, 237 mL, Oral, BID BM, 11/25/19, MD, 237 mL at 09/24/19 0948 .  furosemide (LASIX) injection 20 mg, 20 mg, Intravenous, BID, Regalado, Belkys A,  MD, 20 mg at 09/24/19 1516 .  heparin ADULT infusion 100 units/mL (25000 units/243mL sodium chloride 0.45%), 2,500 Units/hr, Intravenous, Continuous, 11/25/19, MD, Last Rate: 25 mL/hr at 09/24/19 0806, 2,500 Units/hr at 09/24/19 0806 .  HYDROcodone-acetaminophen (NORCO/VICODIN) 5-325 MG per tablet 1-2 tablet, 1-2 tablet, Oral, Q4H PRN, 11/25/19, MD, 2 tablet at 09/24/19 1516 .  HYDROmorphone (DILAUDID) injection 1 mg, 1 mg, Intravenous, Q4H PRN, Jodelle Red, MD, 1 mg at 09/24/19 1339 .  lactated ringers infusion, , Intravenous, Continuous, Jodelle Red, MD .  mupirocin ointment (BACTROBAN) 2 % 1 application, 1 application, Nasal, BID, 11/25/19, MD, 1 application at 09/24/19 [phone] .  ondansetron (ZOFRAN) tablet 4 mg, 4 mg, Oral, Q6H PRN **OR** ondansetron (ZOFRAN) injection 4 mg, 4 mg, Intravenous, Q6H PRN, 11/25/19, MD .  vancomycin (VANCOCIN) IVPB 1000 mg/200 mL premix, 1,000 mg, Intravenous, Q8H, 6389, MD, Last Rate: 200 mL/hr at 09/24/19 0045, 1,000 mg at 09/24/19 0045   Physical Exam: Strength 5/5x4, SILTx4, incision intact w/ dermabond  Assessment & Plan: 34 y.o. man s/p MIS lami for lumbar epidural abscess evac, Cx growing GPCs in clusters, recovering well.  -okay for full dose anticoagulation as needed from my standpoint -activity as tolerated, no brace needed -will sign off, neurologically stable with improved  back pain post-op, abscess drained with cultures growing, will need to see me in 2 weeks in clinic. If he is still inpatient near that time, let me know and I'll see him at [facility] so he doesn't have to come for an outpatient follow up  Jadene Pierini  09/24/19 3:52 PM
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
## 2019-09-24 NOTE — Progress Notes (Signed)
[facility]  Date of Admission:  09/18/2019     Total days of antibiotics 7         ASSESSMENT:  [name] is positive for tricuspid valve endocarditis. Surgical specimen from 7/3 growing MRSA. Blood cultures from 7/1 remain without growth to date. Recommend CVTS evaluation for tricuspid valve endocarditis if he would be a candidate for angiovac. Discussed need for prolonged antibiotic therapy given disseminated MRSA infection. Renal function stable with no evidence of nephrotoxicity with most recent vancomycin trough therapeutic. Pain levels are fluctuating. Continue current dose of vancomcyin  PLAN:  1. Continue vancomycin. 2. Therapeutic drug monitoring of renal function and vancomycin levels.  3. Recommend CVTS evaluation for tricuspid valve endocarditis.  4. Pain management per primary team.  Principal Problem:   MRSA bacteremia Active Problems:   Abscess of paraspinal muscles   Sepsis (HCC)   IVC thrombosis (HCC)   Septic embolism (HCC)   Cellulitis of flank   Chronic, continuous use of opioids   Endocarditis of tricuspid valve   . [MAR Hold] Chlorhexidine Gluconate Cloth  6 each Topical Q0600  . [MAR Hold] feeding supplement (ENSURE ENLIVE)  237 mL Oral BID BM  . [MAR Hold] mupirocin ointment  1 application Nasal BID    SUBJECTIVE:  Afebrile overnight with no acute events. [name] positive for tricupsid valve endocarditis. Having lower right sided back pain that varies in severity and timing.   Allergies  Allergen Reactions  . Tramadol Other (See Comments)    Stomach pain     Review of Systems: Review of Systems  Constitutional: Negative for chills, fever and weight loss.  Respiratory: Negative for cough, shortness of breath and wheezing.   Cardiovascular: Negative for chest pain and leg swelling.  Gastrointestinal: Negative for abdominal pain, constipation, diarrhea, nausea and vomiting.  Musculoskeletal: Positive for back  pain.  Skin: Negative for rash.      OBJECTIVE: Vitals:   09/24/19 0854 09/24/19 0900 09/24/19 0904 09/24/19 0914  BP: 126/72 126/72 (!) 144/83 (!) 164/80  Pulse: 93 90 83 87  Resp: (!) 26 (!) 22 (!) 25 (!) 29  Temp: 98 F (36.7 C)     TempSrc: Oral     SpO2: 98% 95% 98% 98%  Weight:      Height:       Body mass index is 28.21 kg/m.  Physical Exam Constitutional:      General: He is not in acute distress.    Appearance: He is well-developed.  Cardiovascular:     Rate and Rhythm: Normal rate and regular rhythm.     Heart sounds: Normal heart sounds.  Pulmonary:     Effort: Pulmonary effort is normal.     Breath sounds: Normal breath sounds.  Skin:    General: Skin is warm and dry.  Neurological:     Mental Status: He is alert and oriented to person, place, and time.  Psychiatric:        Behavior: Behavior normal.        Thought Content: Thought content normal.        Judgment: Judgment normal.     Lab Results Lab Results  Component Value Date   WBC 18.0 (H) 09/24/2019   HGB 10.7 (L) 09/24/2019   HCT 31.1 (L) 09/24/2019   MCV 87.4 09/24/2019   PLT 380 09/24/2019    Lab Results  Component Value Date   CREATININE 1.05 09/24/2019   BUN 9 09/24/2019  NA 132 (L) 09/24/2019   K 4.0 09/24/2019   CL 97 (L) 09/24/2019   CO2 26 09/24/2019    Lab Results  Component Value Date   ALT 25 09/22/2019   AST 23 09/22/2019   ALKPHOS 53 09/22/2019   BILITOT 0.4 09/22/2019     Microbiology: Recent Results (from the past 240 hour(s))  Culture, blood (x 2)     Status: None (Preliminary result)   Collection Time: 09/19/19  2:39 AM   Specimen: BLOOD  Result Value Ref Range Status   Specimen Description BLOOD LEFT ARM  Final   Special Requests   Final    BOTTLES DRAWN AEROBIC AND ANAEROBIC Blood Culture adequate volume   Culture   Final    NO GROWTH 4 DAYS Performed at [facility], [address], [place], [place] [address]    Report Status PENDING   Incomplete  Culture, blood (x 2)     Status: None (Preliminary result)   Collection Time: 09/19/19  2:39 AM   Specimen: BLOOD  Result Value Ref Range Status   Specimen Description BLOOD LEFT HAND  Final   Special Requests   Final    BOTTLES DRAWN AEROBIC AND ANAEROBIC Blood Culture adequate volume   Culture   Final    NO GROWTH 4 DAYS Performed at [facility], [address], [place], [place] [address]    Report Status PENDING  Incomplete  Surgical pcr screen     Status: Abnormal   Collection Time: 09/20/19 10:54 PM   Specimen: Nasal Mucosa; Nasal Swab  Result Value Ref Range Status   MRSA, PCR POSITIVE (A) NEGATIVE Final    Comment: CRITICAL RESULT CALLED TO, READ BACK BY AND VERIFIED WITH: RN TIM Kayren Eaves [phone] @0153  THANEY    Staphylococcus aureus POSITIVE (A) NEGATIVE Final    Comment: CRITICAL RESULT CALLED TO, READ BACK BY AND VERIFIED WITH: RN Corrie Mckusick @0153  THANEY Performed at [facility], [address] [place], [address] [place]   Aerobic/Anaerobic Culture (surgical/deep wound)     Status: None (Preliminary result)   Collection Time: 09/21/19  9:36 AM   Specimen: Abscess  Result Value Ref Range Status   Specimen Description ABSCESS  Final   Special Requests EPIDURAL  Final   Gram Stain   Final    FEW WBC PRESENT, PREDOMINANTLY PMN MODERATE GRAM POSITIVE COCCI IN PAIRS IN CLUSTERS    Culture   Final    MODERATE METHICILLIN RESISTANT STAPHYLOCOCCUS AUREUS NO ANAEROBES ISOLATED; CULTURE IN PROGRESS FOR 5 DAYS    Report Status PENDING  Incomplete   Organism ID, Bacteria METHICILLIN RESISTANT STAPHYLOCOCCUS AUREUS  Final      Susceptibility   Methicillin resistant staphylococcus aureus - MIC*    CIPROFLOXACIN >=8 RESISTANT Resistant     ERYTHROMYCIN >=8 RESISTANT Resistant     GENTAMICIN <=0.5 SENSITIVE Sensitive     OXACILLIN >=4 RESISTANT Resistant     TETRACYCLINE <=1 SENSITIVE Sensitive     VANCOMYCIN <=0.5 SENSITIVE Sensitive      TRIMETH/SULFA 160 RESISTANT Resistant     CLINDAMYCIN <=0.25 SENSITIVE Sensitive     RIFAMPIN <=0.5 SENSITIVE Sensitive     Inducible Clindamycin Value in next row Sensitive      NEGATIVEPerformed at [facility], [address] [place], [address] [place]    * MODERATE METHICILLIN RESISTANT STAPHYLOCOCCUS AUREUS     [place], NP [facility]  09/24/2019  9:18 AM
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
## 2019-09-24 NOTE — Interval H&P Note (Signed)
History and Physical Interval Note:  09/24/2019 7:44 AM  Scott [name]  has presented today for surgery, with the diagnosis of BACTEREMIA.  The various methods of treatment have been discussed with the patient and family. After consideration of risks, benefits and other options for treatment, the patient has consented to  Procedure(s): TRANSESOPHAGEAL ECHOCARDIOGRAM (TEE) (N/A) as a surgical intervention.  The patient's history has been reviewed, patient examined, no change in status, stable for surgery.  I have reviewed the patient's chart and labs.  Questions were answered to the patient's satisfaction.     Cheral Cappucci Cristal Deer

## 2019-09-24 NOTE — Progress Notes (Signed)
ANTICOAGULATION CONSULT NOTE - Follow Up Consult  Pharmacy Consult for Heparin Indication: Infrarenal IVC thrombus  Allergies  Allergen Reactions  . Tramadol Other (See Comments)    Stomach pain    Patient Measurements: Height: 5\' 7"  (170.2 cm) Weight: 81.7 kg (180 lb 1.9 oz) IBW/kg (Calculated) : 66.1 Heparin Dosing Weight: 81.7 kg  Vital Signs: Temp: 98 F (36.7 C) (07/06 0015) Temp Source: Oral (07/06 0015) BP: 129/70 (07/06 0015) Pulse Rate: 84 (07/06 0015)  Labs: Recent Labs    09/22/19 0720 09/22/19 0720 09/23/19 0417 09/23/19 1628 09/24/19 0105  HGB 10.5*   < > 11.3*  --  10.7*  HCT 30.4*  --  32.7*  --  31.1*  PLT 235  --  334  --  380  HEPARINUNFRC  --   --   --  0.70 0.74*  CREATININE 1.20  --  1.09  --  1.05   < > = values in this interval not displayed.    Estimated Creatinine Clearance: 102.3 mL/min (by C-G formula based on SCr of 1.05 mg/dL).  Assessment: 34 yr old male on heparin infusion for infrarenal IVC thrombus, with concern for septic pulmonary emboli (heparin was held 7/3 X 48 hrs, per Neurosurgery).  Pharmacy was consulted to resume IV heparin on 7/5 (no bolus). Hgb 11.3 up today, platelets WNL (CBC stable).  7/4 Vascular U/S lower extremity: negative 7/4 CTA (abd): 2 sites of nonocclusive septic thrombus involving the infrarenal IVC, most likely secondary to septic thrombophlebitis of the draining paraspinal venous plexus in this patient with epidural abscess  Heparin level ~7 hrs after restarting heparin infusion at 2700 units/hr was 0.70 units/ml, which is at the upper end of the goal range for this pt. Per RN, no issues with IV or bleeding observed.  7/6 AM update:  Heparin level just above goal  Goal of Therapy:  Heparin level 0.3-0.7 units/ml Monitor platelets by anticoagulation protocol: Yes   Plan:  Dec heparin to 2500 units/hr 1000 heparin level Monitor daily heparin level, CBC Monitor for signs/symptoms of  bleeding  9/6, PharmD, BCPS Clinical Pharmacist Phone: [phone]

## 2019-09-25 ENCOUNTER — Inpatient Hospital Stay: Payer: Self-pay

## 2019-09-25 DIAGNOSIS — I079 Rheumatic tricuspid valve disease, unspecified: Secondary | ICD-10-CM

## 2019-09-25 DIAGNOSIS — M6289 Other specified disorders of muscle: Secondary | ICD-10-CM

## 2019-09-25 DIAGNOSIS — G894 Chronic pain syndrome: Secondary | ICD-10-CM

## 2019-09-25 DIAGNOSIS — F1190 Opioid use, unspecified, uncomplicated: Secondary | ICD-10-CM

## 2019-09-25 DIAGNOSIS — I82220 Acute embolism and thrombosis of inferior vena cava: Secondary | ICD-10-CM

## 2019-09-25 DIAGNOSIS — L03319 Cellulitis of trunk, unspecified: Secondary | ICD-10-CM

## 2019-09-25 LAB — BASIC METABOLIC PANEL
Anion gap: 9 (ref 5–15)
BUN: 10 mg/dL (ref 6–20)
CO2: 26 mmol/L (ref 22–32)
Calcium: 7.8 mg/dL — ABNORMAL LOW (ref 8.9–10.3)
Chloride: 96 mmol/L — ABNORMAL LOW (ref 98–111)
Creatinine, Ser: 1.06 mg/dL (ref 0.61–1.24)
GFR calc Af Amer: >60 mL/min (ref >60)
GFR calc non Af Amer: >60 mL/min (ref >60)
Glucose, Bld: 101 mg/dL — ABNORMAL HIGH (ref 70–99)
Potassium: 3.8 mmol/L (ref 3.5–5.1)
Sodium: 131 mmol/L — ABNORMAL LOW (ref 135–145)

## 2019-09-25 LAB — CBC
HCT: 28.9 % — ABNORMAL LOW (ref 39.0–52.0)
Hemoglobin: 9.8 g/dL — ABNORMAL LOW (ref 13.0–17.0)
MCH: 29.8 pg (ref 26.0–34.0)
MCHC: 33.9 g/dL (ref 30.0–36.0)
MCV: 87.8 fL (ref 80.0–100.0)
Platelets: 361 10*3/uL (ref 150–400)
RBC: 3.29 MIL/uL — ABNORMAL LOW (ref 4.22–5.81)
RDW: 13.1 % (ref 11.5–15.5)
WBC: 15.5 10*3/uL — ABNORMAL HIGH (ref 4.0–10.5)
nRBC: 0 % (ref 0.0–0.2)

## 2019-09-25 LAB — HEPATITIS C GENOTYPE

## 2019-09-25 LAB — MAGNESIUM: Magnesium: 2 mg/dL (ref 1.7–2.4)

## 2019-09-25 LAB — HEPARIN LEVEL (UNFRACTIONATED): Heparin Unfractionated: 0.55 IU/mL (ref 0.30–0.70)

## 2019-09-25 MED ORDER — SODIUM CHLORIDE 0.9% FLUSH
10.0000 mL | INTRAVENOUS | Status: DC | PRN
  Administered 2019-09-26 – 2019-09-30 (×2): 10 mL

## 2019-09-25 MED ORDER — CHLORHEXIDINE GLUCONATE CLOTH 2 % EX PADS
6.0000 | MEDICATED_PAD | Freq: Every day | CUTANEOUS | Status: DC
  Administered 2019-09-25 – 2019-10-11 (×17): 6 via TOPICAL

## 2019-09-25 MED ORDER — SODIUM CHLORIDE 0.9% FLUSH
10.0000 mL | Freq: Two times a day (BID) | INTRAVENOUS | Status: DC
  Administered 2019-09-25 – 2019-10-11 (×24): 10 mL
  Administered 2019-10-11: 20 mL
  Administered 2019-10-12: 10 mL

## 2019-09-25 MED ORDER — HEPARIN (PORCINE) 25000 UT/250ML-% IV SOLN
2500.0000 [IU]/h | INTRAVENOUS | Status: AC
  Administered 2019-09-25 – 2019-09-28 (×7): 2500 [IU]/h via INTRAVENOUS
  Filled 2019-09-25 (×7): qty 250

## 2019-09-25 NOTE — Progress Notes (Signed)
ANTICOAGULATION CONSULT NOTE - Follow Up Consult  Pharmacy Consult for Heparin Indication: infra-renal IVC thrombus  Allergies  Allergen Reactions  . Tramadol Other (See Comments)    Stomach pain    Patient Measurements: Height: 5\' 7"  (170.2 cm) Weight: 81.7 kg (180 lb 1.9 oz) IBW/kg (Calculated) : 66.1 Heparin Dosing Weight: 81.7  Vital Signs: Temp: 99 F (37.2 C) (07/07 0726) Temp Source: Oral (07/07 0726) BP: 127/69 (07/07 0726) Pulse Rate: 80 (07/07 0726)  Labs: Recent Labs    09/23/19 0417 09/23/19 1628 09/24/19 0105 09/24/19 0957 09/25/19 0440 09/25/19 0606  HGB 11.3*  --  10.7*  --  9.8*  --   HCT 32.7*  --  31.1*  --  28.9*  --   PLT 334  --  380  --  361  --   HEPARINUNFRC  --    < > 0.74* 0.67  --  0.55  CREATININE 1.09  --  1.05  --  1.06  --    < > = values in this interval not displayed.    Estimated Creatinine Clearance: 101.4 mL/min (by C-G formula based on SCr of 1.06 mg/dL).  Assessment: Heparin for infra-renal IVC thrombus; concern for septic pulmonary emboli. Held 7/3 per neurosurg x 48hrs. Resume IV heparin 7/5 (no bolus).  - HL 0.55 in goal range. Hgb 9.8 dropped overnight. Plts 361 falling. Pt coughed up small amount of bright red blood overnight.  Goal of Therapy:  Heparin level 0.3-0.7 units/ml Monitor platelets by anticoagulation protocol: Yes   Plan:  IV heparin 2500 units/hr Daily HL and CBC  9/5 09/25/2019,7:46 AM

## 2019-09-25 NOTE — Progress Notes (Signed)
Patient removed telemetry and stated he did not want wear it. It was getting in his way. Patient given PRN medications every four hours as ordered per request. Patient has been resting in room throughout day. Pt resting with call bell within reach.  Will continue to monitor.'
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
## 2019-09-25 NOTE — Progress Notes (Signed)
Patient is coughing up bright red blood . R.N. aware Text page Dr Minerva Areola.Awaiting return call

## 2019-09-25 NOTE — Progress Notes (Signed)
Encourage patient to let me put him back on Tele. Explain importance of it . Patient stated, I did not want it on cause I sweat at night and they come off." M.D. please encourage patient on this.

## 2019-09-25 NOTE — Progress Notes (Signed)
Peripherally Inserted Central Catheter Placement  The IV Nurse has discussed with the patient and/or persons authorized to consent for the patient, the purpose of this procedure and the potential benefits and risks involved with this procedure.  The benefits include less needle sticks, lab draws from the catheter, and the patient may be discharged home with the catheter. Risks include, but not limited to, infection, bleeding, blood clot (thrombus formation), and puncture of an artery; nerve damage and irregular heartbeat and possibility to perform a PICC exchange if needed/ordered by physician.  Alternatives to this procedure were also discussed.  Bard Power PICC patient education guide, fact sheet on infection prevention and patient information card has been provided to patient /or left at bedside.    PICC Placement Documentation  PICC Single Lumen 09/25/19 PICC Right Basilic 39 cm 1 cm (Active)  Indication for Insertion or Continuance of Line Prolonged intravenous therapies 09/25/19 1134  Exposed Catheter (cm) 1 cm 09/25/19 1134  Site Assessment Clean;Dry;Intact 09/25/19 1134  Line Status Flushed;Blood return noted 09/25/19 1134  Dressing Type Transparent 09/25/19 1134  Dressing Status Clean;Dry;Intact;Antimicrobial disc in place;Other (Comment) 09/25/19 1134  Dressing Intervention New dressing 09/25/19 1134  Dressing Change Due 10/02/19 09/25/19 1134       Reginia Forts Albarece 09/25/2019, 11:36 AM

## 2019-09-25 NOTE — Progress Notes (Signed)
Scott Green Pharm aware patient is coughing up small amt. Of bright red blood. See orders for stat Hepatin level

## 2019-09-25 NOTE — Progress Notes (Signed)
Dr. Minerva Areola return call and will pass it on to day shift M.D. cont. To monitor patient and rhythm

## 2019-09-26 ENCOUNTER — Encounter (HOSPITAL_COMMUNITY): Payer: Self-pay | Admitting: Cardiology

## 2019-09-26 DIAGNOSIS — B1710 Acute hepatitis C without hepatic coma: Secondary | ICD-10-CM

## 2019-09-26 DIAGNOSIS — R109 Unspecified abdominal pain: Secondary | ICD-10-CM

## 2019-09-26 DIAGNOSIS — M549 Dorsalgia, unspecified: Secondary | ICD-10-CM

## 2019-09-26 LAB — AEROBIC/ANAEROBIC CULTURE W GRAM STAIN (SURGICAL/DEEP WOUND)

## 2019-09-26 LAB — CBC
HCT: 29.8 % — ABNORMAL LOW (ref 39.0–52.0)
Hemoglobin: 10 g/dL — ABNORMAL LOW (ref 13.0–17.0)
MCH: 29.6 pg (ref 26.0–34.0)
MCHC: 33.6 g/dL (ref 30.0–36.0)
MCV: 88.2 fL (ref 80.0–100.0)
Platelets: 412 10*3/uL — ABNORMAL HIGH (ref 150–400)
RBC: 3.38 MIL/uL — ABNORMAL LOW (ref 4.22–5.81)
RDW: 13.1 % (ref 11.5–15.5)
WBC: 13.9 10*3/uL — ABNORMAL HIGH (ref 4.0–10.5)
nRBC: 0 % (ref 0.0–0.2)

## 2019-09-26 LAB — HEPARIN LEVEL (UNFRACTIONATED): Heparin Unfractionated: 0.38 IU/mL (ref 0.30–0.70)

## 2019-09-26 LAB — BASIC METABOLIC PANEL
Anion gap: 7 (ref 5–15)
BUN: 9 mg/dL (ref 6–20)
CO2: 26 mmol/L (ref 22–32)
Calcium: 7.9 mg/dL — ABNORMAL LOW (ref 8.9–10.3)
Chloride: 99 mmol/L (ref 98–111)
Creatinine, Ser: 1.03 mg/dL (ref 0.61–1.24)
GFR calc Af Amer: >60 mL/min (ref >60)
GFR calc non Af Amer: >60 mL/min (ref >60)
Glucose, Bld: 146 mg/dL — ABNORMAL HIGH (ref 70–99)
Potassium: 3.8 mmol/L (ref 3.5–5.1)
Sodium: 132 mmol/L — ABNORMAL LOW (ref 135–145)

## 2019-09-26 LAB — VANCOMYCIN, TROUGH: Vancomycin Tr: 19 ug/mL (ref 15–20)

## 2019-09-26 NOTE — Progress Notes (Signed)
[facility]  Date of Admission:  09/18/2019     Total days of antibiotics 9         ASSESSMENT:  Mr. Bonura is doing well. Continues to have flank pain with no significant findings noted on CT. Likely will improve with antibiotic therapy and time. Discussed plan of care to include recommended IV antibiotics through 7/28 and then can transition to long acting ortivancin/delbavancin for 2 weeks or Zyvox 600 mg bid for 2 weeks. Renal function remains without evidence of nephrotoxicity. ID will follow peripherally for now. Continue current dose of vancomycin. Will arrange Hepatitis C treatment as outpatient.   PLAN:  1. Continue current dose of vancomycin.  2. Monitor renal function twice weekly and vancomycin troughs per pharmacy protocol. 3. Pain management per primary team. 4. Hepatitis C to be treated outpatent. 5. ID to follow peripherally.   Principal Problem:   MRSA bacteremia Active Problems:   Abscess of paraspinal muscles   Sepsis (HCC)   IVC thrombosis (HCC)   Septic embolism (HCC)   Cellulitis of flank   Chronic, continuous use of opioids   Endocarditis of tricuspid valve    Chlorhexidine Gluconate Cloth  6 each Topical Daily   feeding supplement (ENSURE ENLIVE)  237 mL Oral BID BM   mupirocin ointment  1 application Nasal BID   sodium chloride flush  10-40 mL Intracatheter Q12H    SUBJECTIVE:  Afebrile overnight with no acute events. Continues to have pain in the right flank believes to be some swelling or fluid. Denies fevers, chills or sweats. Has been out of bed to use the bathroom.   Allergies  Allergen Reactions   Tramadol Other (See Comments)    Stomach pain     Review of Systems: Review of Systems  Constitutional: Negative for chills, fever and weight loss.  Respiratory: Negative for cough, shortness of breath and wheezing.   Cardiovascular: Negative for chest pain and leg swelling.  Gastrointestinal: Negative  for abdominal pain, constipation, diarrhea, nausea and vomiting.  Musculoskeletal: Positive for back pain.  Skin: Negative for rash.      OBJECTIVE: Vitals:   09/25/19 1619 09/25/19 2212 09/26/19 0735 09/26/19 0900  BP: 135/83 128/77 121/82 (!) 145/69  Pulse: 85 85 (!) 106 89  Resp: 18 18 17 18   Temp: 98.5 F (36.9 C) 98.8 F (37.1 C) 98.6 F (37 C) 98.5 F (36.9 C)  TempSrc: Oral Oral Oral Oral  SpO2: 100% 98% 97% 99%  Weight:      Height:       Body mass index is 28.21 kg/m.  Physical Exam Constitutional:      General: He is not in acute distress.    Appearance: He is well-developed.     Comments: Lying in bed; pleasant.   Cardiovascular:     Rate and Rhythm: Normal rate and regular rhythm.     Heart sounds: Normal heart sounds.  Pulmonary:     Effort: Pulmonary effort is normal.     Breath sounds: Normal breath sounds.  Musculoskeletal:     Comments: Surgical incision well approximated with dermabond.   Skin:    General: Skin is warm and dry.  Neurological:     Mental Status: He is alert and oriented to person, place, and time.  Psychiatric:        Mood and Affect: Mood normal.     Lab Results Lab Results  Component Value Date   WBC 15.5 (H) 09/25/2019  HGB 9.8 (L) 09/25/2019   HCT 28.9 (L) 09/25/2019   MCV 87.8 09/25/2019   PLT 361 09/25/2019    Lab Results  Component Value Date   CREATININE 1.06 09/25/2019   BUN 10 09/25/2019   NA 131 (L) 09/25/2019   K 3.8 09/25/2019   CL 96 (L) 09/25/2019   CO2 26 09/25/2019    Lab Results  Component Value Date   ALT 25 09/22/2019   AST 23 09/22/2019   ALKPHOS 53 09/22/2019   BILITOT 0.4 09/22/2019     Microbiology: Recent Results (from the past 240 hour(s))  Culture, blood (x 2)     Status: None   Collection Time: 09/19/19  2:39 AM   Specimen: BLOOD  Result Value Ref Range Status   Specimen Description BLOOD LEFT ARM  Final   Special Requests   Final    BOTTLES DRAWN AEROBIC AND ANAEROBIC  Blood Culture adequate volume   Culture   Final    NO GROWTH 5 DAYS Performed at [facility], [address], [place], [place] [address]    Report Status 09/24/2019 FINAL  Final  Culture, blood (x 2)     Status: None   Collection Time: 09/19/19  2:39 AM   Specimen: BLOOD  Result Value Ref Range Status   Specimen Description BLOOD LEFT HAND  Final   Special Requests   Final    BOTTLES DRAWN AEROBIC AND ANAEROBIC Blood Culture adequate volume   Culture   Final    NO GROWTH 5 DAYS Performed at [facility], [address], [place], [place] [address]    Report Status 09/24/2019 FINAL  Final  Surgical pcr screen     Status: Abnormal   Collection Time: 09/20/19 10:54 PM   Specimen: Nasal Mucosa; Nasal Swab  Result Value Ref Range Status   MRSA, PCR POSITIVE (A) NEGATIVE Final    Comment: CRITICAL RESULT CALLED TO, READ BACK BY AND VERIFIED WITH: RN TIM Kayren Eaves [phone] @0153  THANEY    Staphylococcus aureus POSITIVE (A) NEGATIVE Final    Comment: CRITICAL RESULT CALLED TO, READ BACK BY AND VERIFIED WITH: RN Corrie Mckusick @0153  THANEY Performed at [facility], [address] [place], [address] [place]   Aerobic/Anaerobic Culture (surgical/deep wound)     Status: None (Preliminary result)   Collection Time: 09/21/19  9:36 AM   Specimen: Abscess  Result Value Ref Range Status   Specimen Description ABSCESS  Final   Special Requests EPIDURAL  Final   Gram Stain   Final    FEW WBC PRESENT, PREDOMINANTLY PMN MODERATE GRAM POSITIVE COCCI IN PAIRS IN CLUSTERS    Culture   Final    MODERATE METHICILLIN RESISTANT STAPHYLOCOCCUS AUREUS NO ANAEROBES ISOLATED; CULTURE IN PROGRESS FOR 5 DAYS    Report Status PENDING  Incomplete   Organism ID, Bacteria METHICILLIN RESISTANT STAPHYLOCOCCUS AUREUS  Final      Susceptibility   Methicillin resistant staphylococcus aureus - MIC*    CIPROFLOXACIN >=8 RESISTANT Resistant     ERYTHROMYCIN >=8 RESISTANT Resistant      GENTAMICIN <=0.5 SENSITIVE Sensitive     OXACILLIN >=4 RESISTANT Resistant     TETRACYCLINE <=1 SENSITIVE Sensitive     VANCOMYCIN <=0.5 SENSITIVE Sensitive     TRIMETH/SULFA 160 RESISTANT Resistant     CLINDAMYCIN <=0.25 SENSITIVE Sensitive     RIFAMPIN <=0.5 SENSITIVE Sensitive     Inducible Clindamycin Value in next row Sensitive  NEGATIVEPerformed at [facility], [address], [place], [place] [address]    * MODERATE METHICILLIN RESISTANT STAPHYLOCOCCUS AUREUS     Marcos Eke, NP [facility]  09/26/2019  9:47 AM
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
## 2019-09-26 NOTE — Progress Notes (Signed)
ANTICOAGULATION CONSULT NOTE - Follow Up Consult  Pharmacy Consult for Heparin Indication: infra-renal IVC thrombus  Allergies  Allergen Reactions  . Tramadol Other (See Comments)    Stomach pain    Patient Measurements: Height: 5\' 7"  (170.2 cm) Weight: 81.7 kg (180 lb 1.9 oz) IBW/kg (Calculated) : 66.1 Heparin Dosing Weight: 81.7  Vital Signs: Temp: 98.5 F (36.9 C) (07/08 0900) Temp Source: Oral (07/08 0900) BP: 145/69 (07/08 0900) Pulse Rate: 89 (07/08 0900)  Labs: Recent Labs    09/24/19 0105 09/24/19 0105 09/24/19 0957 09/25/19 0440 09/25/19 0606 09/26/19 1422 09/26/19 1428  HGB 10.7*   < >  --  9.8*  --  10.0*  --   HCT 31.1*  --   --  28.9*  --  29.8*  --   PLT 380  --   --  361  --  412*  --   HEPARINUNFRC 0.74*   < > 0.67  --  0.55  --  0.38  CREATININE 1.05  --   --  1.06  --  1.03  --    < > = values in this interval not displayed.    Estimated Creatinine Clearance: 104.3 mL/min (by C-G formula based on SCr of 1.03 mg/dL).  Assessment: Heparin for infra-renal IVC thrombus; concern for septic pulmonary emboli. Heparin held 7/3 per neurosurg x 48hrs. Resume IV heparin 7/5 (no bolus).  - HL 0.38 in goal range on heparin drip rate 2500 uts/hr. Hgb 10 stable. Plts 300-400 stable, no bleeding.   Goal of Therapy:  Heparin level 0.3-0.7 units/ml Monitor platelets by anticoagulation protocol: Yes   Plan:  Continue IV heparin drip 2500 units/hr Daily HL and CBC Monitor s/s bleeding  9/5 Pharm.D. CPP, BCPS Clinical Pharmacist [phone] 09/26/2019 3:16 PM

## 2019-09-26 NOTE — Progress Notes (Signed)
Pharmacy Antibiotic Note  Scott Green is a 34 y.o. male admitted on 09/18/2019 with MRSA bacteremia, septic pulmonary emboli and L1-3 and maybe T2 vertebral infection + paraspinal muscles abscesses..  Pharmacy has been consulted for Vancomycin dosing.  Assessment: D8 abx for MRSA bacteremia, septic pulmonary emboli and L1-3 and maybe T2 vertebral infection + paraspinal muscles abscesses. - afebrile, WBC 13.9 down, Scr WNL. -  7/1 TTE negative for vegetations -  7/4: MIS Laminectomy and abscess drainage   Vancomycin trough checked this evening , trough is 19 mcg/ml, confirmed with RN that trough was drawn prior to giving the dose at 17:15 tonight.  Trough is therapeutic on vancomycin 1 gm IV q8 hour. Goal 15-20 mcg/ml.    Antimicrobials this admission: - per [facility] chart, got Vanc 2gm 6/29 at 6pm then 2gm IV q12h (?) > last given 6/30 at 1740 Vancomycin 6/29>> Zosyn 6/29 >> 7/1  Dose adjustments: 7/2: VT 1313 = 17 mcg/ml on 1 gm IV q8h - continue 7/8 VT = 19 mcg/ml on 1 gm IV q8h- continue   Microbiology: 6/29 COVID: negative (at [facility]) 6/29 Blood at [facility]: MRSA 7/1 blood x 2: negative 7/2: epidural abscess: SA 7/1 HIV: non-reactive 7/1: Hep C positive 7/1 HIV: non-reactive 7/2 MRSA PCR pos; SA: pos 7/3: epidural abscess Cx:  M RSA   Plan: Continue Vancomycin 1g IV Q8h Repeat Vanco trough level weekly Monitor clinical progress, renal function.   Height: 5\' 7"  (170.2 cm) Weight: 81.7 kg (180 lb 1.9 oz) IBW/kg (Calculated) : 66.1  Temp (24hrs), Avg:98.6 F (37 C), Min:98.5 F (36.9 C), Max:98.8 F (37.1 C)  Recent Labs  Lab 09/20/19 1313 09/21/19 0338 09/22/19 0720 09/23/19 0417 09/24/19 0105 09/25/19 0440 09/26/19 1422 09/26/19 1722  WBC  --    < > 16.8* 16.1* 18.0* 15.5* 13.9*  --   CREATININE  --    < > 1.20 1.09 1.05 1.06 1.03  --   VANCOTROUGH 17  --   --   --   --   --   --  19   < > = values in this interval not displayed.    Estimated Creatinine  Clearance: 104.3 mL/min (by C-G formula based on SCr of 1.03 mg/dL).    Allergies  Allergen Reactions  . Tramadol Other (See Comments)    Stomach pain   Thank you for allowing pharmacy to be part of this patients care team. 11/27/19, RPh Clinical Pharmacist Please check AMION for all [facility] Pharmacy phone numbers After 10:00 PM, call Main Pharmacy [phone] 09/26/2019 6:34 PM

## 2019-09-27 LAB — HEPARIN LEVEL (UNFRACTIONATED): Heparin Unfractionated: 0.52 IU/mL (ref 0.30–0.70)

## 2019-09-27 MED ORDER — APIXABAN 5 MG PO TABS
10.0000 mg | ORAL_TABLET | Freq: Two times a day (BID) | ORAL | Status: AC
  Administered 2019-09-28 – 2019-09-29 (×4): 10 mg via ORAL
  Filled 2019-09-27 (×4): qty 2

## 2019-09-27 MED ORDER — APIXABAN 5 MG PO TABS
5.0000 mg | ORAL_TABLET | Freq: Two times a day (BID) | ORAL | Status: DC
  Administered 2019-09-30 – 2019-10-12 (×25): 5 mg via ORAL
  Filled 2019-09-27 (×25): qty 1
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
## 2019-09-27 NOTE — Progress Notes (Addendum)
ANTICOAGULATION CONSULT NOTE - Follow Up Consult  Pharmacy Consult for Heparin > apixaban Indication: infra-renal IVC thrombus  Allergies  Allergen Reactions  . Tramadol Other (See Comments)    Stomach pain    Patient Measurements: Height: 5\' 7"  (170.2 cm) Weight: 81.7 kg (180 lb 1.9 oz) IBW/kg (Calculated) : 66.1 Heparin Dosing Weight: 81.7  Vital Signs: Temp: 98.2 F (36.8 C) (07/09 0441) Temp Source: Oral (07/09 0441) BP: 109/70 (07/09 0035) Pulse Rate: 95 (07/09 0441)  Labs: Recent Labs    09/24/19 0957 09/25/19 0440 09/25/19 0606 09/26/19 1422 09/26/19 1428 09/27/19 0551  HGB  --  9.8*  --  10.0*  --   --   HCT  --  28.9*  --  29.8*  --   --   PLT  --  361  --  412*  --   --   HEPARINUNFRC   < >  --  0.55  --  0.38 0.52  CREATININE  --  1.06  --  1.03  --   --    < > = values in this interval not displayed.    Estimated Creatinine Clearance: 104.3 mL/min (by C-G formula based on SCr of 1.03 mg/dL).  Assessment: Heparin for infra-renal IVC thrombus; concern for septic pulmonary emboli. Heparin held 7/3 per neurosurg x 48hrs. Resume IV heparin 7/5 (no bolus).  - HL 0.5 in goal range on heparin drip rate 2500 uts/hr. Hgb 10 stable. Plts 300-400 stable - per labs 7/8, no bleeding.   PM spoke to MD regarding no more procedures scheduled - pt can switch to oral anticoagulation.  Patient has had consistent anticoagulationsince 7/5 Will begin apixaban in am with 2 days of 10mg  BID - to complete 7days of initial anticoagulation in setting of IVC clot. Then start apixaban 5mg  BID on Monday 7/12  Goal of Therapy:  Heparin level 0.3-0.7 units/ml Monitor platelets by anticoagulation protocol: Yes   Plan:  Continue IV heparin drip 2500 units/hr - stop at 10am after 1st dose apixaban given apixaban 10mg  BID x4 doses then 5mg  BID Daily HL and CBC Monitor s/s bleeding  Pharm.D. CPP, BCPS Clinical Pharmacist [phone] 09/27/2019 8:07 AM

## 2019-09-28 LAB — HEPARIN LEVEL (UNFRACTIONATED): Heparin Unfractionated: 0.64 IU/mL (ref 0.30–0.70)

## 2019-09-28 MED ORDER — CYCLOBENZAPRINE HCL 10 MG PO TABS
5.0000 mg | ORAL_TABLET | Freq: Three times a day (TID) | ORAL | Status: DC | PRN
  Administered 2019-09-28 – 2019-10-12 (×25): 5 mg via ORAL
  Filled 2019-09-28 (×26): qty 1

## 2019-09-28 MED ORDER — CYCLOBENZAPRINE HCL 10 MG PO TABS: 5.0000 mg | ORAL_TABLET | Freq: Three times a day (TID) | ORAL | Status: DC

## 2019-09-28 MED ORDER — MORPHINE SULFATE ER 15 MG PO TBCR
15.0000 mg | EXTENDED_RELEASE_TABLET | Freq: Two times a day (BID) | ORAL | Status: DC
  Administered 2019-09-28 – 2019-09-30 (×5): 15 mg via ORAL
  Filled 2019-09-28 (×5): qty 1
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
## 2019-09-29 MED ORDER — LORAZEPAM 1 MG PO TABS
1.0000 mg | ORAL_TABLET | Freq: Once | ORAL | Status: AC | PRN
  Administered 2019-09-29: 1 mg via ORAL
  Filled 2019-09-29: qty 1

## 2019-09-29 MED ORDER — HYDROXYZINE HCL 25 MG PO TABS
25.0000 mg | ORAL_TABLET | Freq: Three times a day (TID) | ORAL | Status: DC | PRN
  Administered 2019-09-30 – 2019-10-07 (×5): 25 mg via ORAL
  Filled 2019-09-29 (×5): qty 1

## 2019-09-29 NOTE — Progress Notes (Signed)
Pharmacy Antibiotic Note  Scott Green is a 34 y.o. male admitted on 09/18/2019 with MRSA bacteremia, septic pulmonary emboli and L1-3 and maybe T2 vertebral infection + paraspinal muscles abscesses.  Pharmacy has been consulted for Vancomycin dosing.  Assessment: D8 abx for MRSA bacteremia, septic pulmonary emboli and L1-3 and maybe T2 vertebral infection + paraspinal muscles abscesses. - afebrile, WBC down, Scr WNL. -  7/1 TTE negative for vegetations -  7/4: MIS Laminectomy and abscess drainage   Vancomycin trough checked 7/8, trough is 19 mcg/ml. Trough is therapeutic on vancomycin 1 gm IV q8 hour. Goal 15-20 mcg/ml.   Antimicrobials this admission: - per [facility] chart, got Vanc 2gm 6/29 at 6pm then 2gm IV q12h (?) > last given 6/30 at 1740 Vancomycin 6/29>> (7/29) Zosyn 6/29 >> 7/1  Dose adjustments: 7/2: VT 1313 = 17 mcg/ml on 1 gm IV q8h - continue 7/8 VT = 19 mcg/ml on 1 gm IV q8h- continue   Microbiology: 6/29 COVID: negative (at [facility]) 6/29 Blood at [facility]: MRSA 7/1 blood x 2: negative 7/2: epidural abscess: SA 7/1 HIV: non-reactive 7/1: Hep C positive 7/1 HIV: non-reactive 7/2 MRSA PCR pos; SA: pos 7/3: epidural abscess Cx:  M RSA  Plan: Continue Vancomycin 1g IV Q8h Repeat Vanco trough level weekly (next 7/15) Monitor clinical progress, renal function.  Height: 5\' 7"  (170.2 cm) Weight: 81.7 kg (180 lb 1.9 oz) IBW/kg (Calculated) : 66.1  Temp (24hrs), Avg:99.2 F (37.3 C), Min:98.9 F (37.2 C), Max:99.6 F (37.6 C)  Recent Labs  Lab 09/23/19 0417 09/24/19 0105 09/25/19 0440 09/26/19 1422 09/26/19 1722  WBC 16.1* 18.0* 15.5* 13.9*  --   CREATININE 1.09 1.05 1.06 1.03  --   VANCOTROUGH  --   --   --   --  19    Estimated Creatinine Clearance: 104.3 mL/min (by C-G formula based on SCr of 1.03 mg/dL).    Allergies  Allergen Reactions  . Tramadol Other (See Comments)    Stomach pain   Thank you for allowing pharmacy to be part of this patients care  team.  11/27/19, PharmD PGY1 Pharmacy Resident Phone: [phone] 09/29/2019 10:39 AM
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
## 2019-09-30 DIAGNOSIS — G894 Chronic pain syndrome: Secondary | ICD-10-CM

## 2019-09-30 MED ORDER — MORPHINE SULFATE ER 15 MG PO TBCR
30.0000 mg | EXTENDED_RELEASE_TABLET | Freq: Two times a day (BID) | ORAL | Status: DC
  Administered 2019-09-30 – 2019-10-01 (×2): 30 mg via ORAL
  Filled 2019-09-30 (×2): qty 2

## 2019-09-30 NOTE — Discharge Instructions (Signed)
Information on my medicine - ELIQUIS (apixaban)  This medication education was reviewed with me or my healthcare representative as part of my discharge preparation.    Why was Eliquis prescribed for you? Eliquis was prescribed to treat blood clots that may have been found in the veins of your legs (deep vein thrombosis) or in your lungs (pulmonary embolism) and to reduce the risk of them occurring again.  What do You need to know about Eliquis ? Continue 5 mg tablet taken TWICE daily.  Eliquis may be taken with or without food.   Try to take the dose about the same time in the morning and in the evening. If you have difficulty swallowing the tablet whole please discuss with your pharmacist how to take the medication safely.  Take Eliquis exactly as prescribed and DO NOT stop taking Eliquis without talking to the doctor who prescribed the medication.  Stopping may increase your risk of developing a new blood clot.  Refill your prescription before you run out.  After discharge, you should have regular check-up appointments with your healthcare provider that is prescribing your Eliquis.    What do you do if you miss a dose? If a dose of ELIQUIS is not taken at the scheduled time, take it as soon as possible on the same day and twice-daily administration should be resumed. The dose should not be doubled to make up for a missed dose.  Important Safety Information A possible side effect of Eliquis is bleeding. You should call your healthcare provider right away if you experience any of the following: ? Bleeding from an injury or your nose that does not stop. ? Unusual colored urine (red or dark brown) or unusual colored stools (red or black). ? Unusual bruising for unknown reasons. ? A serious fall or if you hit your head (even if there is no bleeding).  Some medicines may interact with Eliquis and might increase your risk of bleeding or clotting while on Eliquis. To help avoid this,  consult your healthcare provider or pharmacist prior to using any new prescription or non-prescription medications, including herbals, vitamins, non-steroidal anti-inflammatory drugs (NSAIDs) and supplements.  This website has more information on Eliquis (apixaban): http://[email or web address].com/eliquis/home
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
## 2019-10-01 LAB — BASIC METABOLIC PANEL
Anion gap: 12 (ref 5–15)
BUN: 18 mg/dL (ref 6–20)
CO2: 25 mmol/L (ref 22–32)
Calcium: 8.8 mg/dL — ABNORMAL LOW (ref 8.9–10.3)
Chloride: 92 mmol/L — ABNORMAL LOW (ref 98–111)
Creatinine, Ser: 1.12 mg/dL (ref 0.61–1.24)
GFR calc Af Amer: >60 mL/min (ref >60)
GFR calc non Af Amer: >60 mL/min (ref >60)
Glucose, Bld: 172 mg/dL — ABNORMAL HIGH (ref 70–99)
Potassium: 3.6 mmol/L (ref 3.5–5.1)
Sodium: 129 mmol/L — ABNORMAL LOW (ref 135–145)

## 2019-10-01 LAB — OSMOLALITY: Osmolality: 284 mOsm/kg (ref 275–295)

## 2019-10-01 MED ORDER — MORPHINE SULFATE ER 15 MG PO TBCR
45.0000 mg | EXTENDED_RELEASE_TABLET | Freq: Two times a day (BID) | ORAL | Status: DC
  Administered 2019-10-01 – 2019-10-12 (×22): 45 mg via ORAL
  Filled 2019-10-01 (×22): qty 3

## 2019-10-01 MED ORDER — MORPHINE SULFATE ER 15 MG PO TBCR
15.0000 mg | EXTENDED_RELEASE_TABLET | Freq: Once | ORAL | Status: AC
  Administered 2019-10-01: 15 mg via ORAL
  Filled 2019-10-01: qty 1
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
## 2019-10-01 NOTE — Progress Notes (Signed)
Patient refusing cardiac monitoring. 

## 2019-10-02 DIAGNOSIS — I2690 Septic pulmonary embolism without acute cor pulmonale: Secondary | ICD-10-CM

## 2019-10-02 LAB — CBC
HCT: 31.3 % — ABNORMAL LOW (ref 39.0–52.0)
Hemoglobin: 10.5 g/dL — ABNORMAL LOW (ref 13.0–17.0)
MCH: 29.3 pg (ref 26.0–34.0)
MCHC: 33.5 g/dL (ref 30.0–36.0)
MCV: 87.4 fL (ref 80.0–100.0)
Platelets: 238 10*3/uL (ref 150–400)
RBC: 3.58 MIL/uL — ABNORMAL LOW (ref 4.22–5.81)
RDW: 12.9 % (ref 11.5–15.5)
WBC: 7.2 10*3/uL (ref 4.0–10.5)
nRBC: 0 % (ref 0.0–0.2)

## 2019-10-02 LAB — BASIC METABOLIC PANEL
Anion gap: 9 (ref 5–15)
BUN: 21 mg/dL — ABNORMAL HIGH (ref 6–20)
CO2: 26 mmol/L (ref 22–32)
Calcium: 8.6 mg/dL — ABNORMAL LOW (ref 8.9–10.3)
Chloride: 93 mmol/L — ABNORMAL LOW (ref 98–111)
Creatinine, Ser: 0.91 mg/dL (ref 0.61–1.24)
GFR calc Af Amer: >60 mL/min (ref >60)
GFR calc non Af Amer: >60 mL/min (ref >60)
Glucose, Bld: 108 mg/dL — ABNORMAL HIGH (ref 70–99)
Potassium: 4.3 mmol/L (ref 3.5–5.1)
Sodium: 128 mmol/L — ABNORMAL LOW (ref 135–145)

## 2019-10-02 NOTE — Plan of Care (Signed)
  Problem: Activity: Goal: Risk for activity intolerance will decrease Outcome: Progressing   
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
## 2019-10-02 NOTE — Progress Notes (Signed)
Pharmacy Antibiotic Note  Scott Green is a 34 y.o. male admitted on 09/18/2019 with MRSA bacteremia, septic pulmonary emboli and L1-3 and maybe T2 vertebral infection + paraspinal muscles abscesses.  Pharmacy has been consulted for Vancomycin dosing.  Assessment: D15 abx for MRSA bacteremia, septic pulmonary emboli and L1-3 and maybe T2 vertebral infection + paraspinal muscles abscesses.  -  7/1 TTE negative for vegetations -  7/4: MIS Laminectomy and abscess drainage   Last vancomycin trough 7/8 therapeutic on vancomycin 1 gm IV q8 hour. Goal 15-20 mcg/ml. Next trough due 7/15.  Afebrile, WBC down, Scr WNL.  Antimicrobials this admission: Vancomycin 6/29>> (7/29) Zosyn 6/29 >> 7/1  Dose adjustments: 7/2: VT 1313 = 17 mcg/ml on 1 gm IV q8h - continue 7/8 VT = 19 mcg/ml on 1 gm IV q8h - continue   Microbiology: 6/29 COVID: negative (at [facility]) 6/29 Blood at [facility]: MRSA 7/1 blood x 2: NGTD 7/2: epidural abscess: SA 7/1 HIV: non-reactive 7/1: Hep C positive 7/1 HIV: non-reactive 7/3: epidural abscess Cx:  MRSA MRSA PCR pos  Plan: Continue Vancomycin 1g IV Q8h Repeat Vanco trough level weekly (next 7/15) Monitor clinical progress, renal function.  Height: 5\' 7"  (170.2 cm) Weight: 81.7 kg (180 lb 1.9 oz) IBW/kg (Calculated) : 66.1  Temp (24hrs), Avg:99 F (37.2 C), Min:98.3 F (36.8 C), Max:100.2 F (37.9 C)  Recent Labs  Lab 09/26/19 1422 09/26/19 1722 10/01/19 1055 10/02/19 0400  WBC 13.9*  --   --  7.2  CREATININE 1.03  --  1.12 0.91  VANCOTROUGH  --  19  --   --     Estimated Creatinine Clearance: 118.1 mL/min (by C-G formula based on SCr of 0.91 mg/dL).    Allergies  Allergen Reactions  . Tramadol Other (See Comments)    Stomach pain   Thank you for allowing pharmacy to be part of this patients care team.  10/04/19, PharmD PGY-1 Pharmacy Resident 10/02/2019 10:25 AM  Please check [email or web address] for unit-specific pharmacy phone numbers.

## 2019-10-03 LAB — VANCOMYCIN, TROUGH: Vancomycin Tr: 21 ug/mL (ref 15–20)

## 2019-10-03 MED ORDER — VANCOMYCIN HCL 1250 MG/250ML IV SOLN
1250.0000 mg | Freq: Two times a day (BID) | INTRAVENOUS | Status: DC
  Administered 2019-10-03 – 2019-10-06 (×6): 1250 mg via INTRAVENOUS
  Filled 2019-10-03 (×7): qty 250

## 2019-10-03 NOTE — Plan of Care (Signed)
  Problem: Clinical Measurements: Goal: Signs and symptoms of infection will decrease Outcome: Progressing   
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
## 2019-10-03 NOTE — Progress Notes (Signed)
Pharmacy Antibiotic Note  Scott Green is a 34 y.o. male admitted on 09/18/2019 with MRSA bacteremia, septic pulmonary emboli and L1-3 and maybe T2 vertebral infection + paraspinal muscles abscesses.  Pharmacy has been consulted for Vancomycin dosing.  Assessment: D16 abx for MRSA bacteremia, septic pulmonary emboli and L1-3 and maybe T2 vertebral infection + paraspinal muscles abscesses.  -  7/1 TTE negative for vegetations -  7/4: MIS Laminectomy and abscess drainage   Last vancomycin troughs 7/2 and 7/8 therapeutic but trending up on vancomycin 1 gm IV every 8 hours. Vanc trough 7/15 elevated at 21 on same dose. Will lower dose to target trough goal 15-20 mcg/ml. Next trough due 7/22 but can consider trough at steady state with change in dose Afebrile, WBC down, Scr WNL.  Antimicrobials this admission: Vancomycin 6/29>> (7/28) Zosyn 6/29 >> 7/1  Dose adjustments: 7/2: VT 1313 = 17 mcg/ml on 1 gm IV q8h - continue 7/8 VT = 19 mcg/ml on 1 gm IV q8h - continue  7/15 VT = 21 mcg/ml on 1 gm IV q8h - lower dose  Microbiology: 6/29 COVID: negative (at [facility]) 6/29 Blood at [facility]: MRSA 7/1 blood x 2: NGTD 7/2: epidural abscess: SA 7/1 HIV: non-reactive 7/1: Hep C positive 7/1 HIV: non-reactive 7/3: epidural abscess Cx:  MRSA MRSA PCR pos  Plan: Decrease Vancomycin to 1250mg  IV q12h Repeat Vanco trough level weekly (next 7/22), consider trough at steady state with change in dose Monitor clinical progress, renal function.  Height: 5\' 7"  (170.2 cm) Weight: 81.7 kg (180 lb 1.9 oz) IBW/kg (Calculated) : 66.1  Temp (24hrs), Avg:99.7 F (37.6 C), Min:98.5 F (36.9 C), Max:101.2 F (38.4 C)  Recent Labs  Lab 09/26/19 1422 09/26/19 1722 10/01/19 1055 10/02/19 0400 10/03/19 0845  WBC 13.9*  --   --  7.2  --   CREATININE 1.03  --  1.12 0.91  --   VANCOTROUGH  --  19  --   --  21*    Estimated Creatinine Clearance: 118.1 mL/min (by C-G formula based on SCr of 0.91 mg/dL).     Allergies  Allergen Reactions   Tramadol Other (See Comments)    Stomach pain   Thank you for allowing pharmacy to be part of this patients care team.  10/04/19, PharmD PGY1 Acute Care Pharmacy Resident Phone: [phone] 10/03/2019 10:46 AM  Please check [email or web address] for unit specific pharmacy phone numbers.

## 2019-10-03 NOTE — TOC Initial Note (Signed)
Transition of Care [facility]) - Initial/Assessment Note    Patient Details  Name: Scott Green MRN: [id number] Date of Birth: 05/23/1985  Transition [facility]) CM/SW Contact:    Lockie Pares, RN Phone Number: 10/03/2019, 12:40 PM  Clinical Narrative:                 Admitted with Bacteremia, on po narcotics for chronic back pain, positive on arrival for amphetamines, THC, and opiates, denies drug use (IV) has abbesses, is  MRSA positive on Vancomycin. Positive hep C. Endocarditis. Needs ABX therapy through July 28th. ID following.. Letter written today as he has court prior to discharge. Will continue to follow for needs.    Expected Discharge Plan: Home/Self Care Barriers to Discharge: Continued Medical Work up   Patient Goals and CMS Choice Patient states their goals for this hospitalization and ongoing recovery are:: home      Expected Discharge Plan and Services Expected Discharge Plan: Home/Self Care                                              Prior Living Arrangements/Services              Need for Family Participation in Patient Care: Yes (Comment) Care giver support system in place?: Yes (comment)   Criminal Activity/Legal Involvement Pertinent to Current Situation/Hospitalization: No - Comment as needed  Activities of Daily Living Home Assistive Devices/Equipment: None ADL Screening (condition at time of admission) Patient's cognitive ability adequate to safely complete daily activities?: Yes Is the patient deaf or have difficulty hearing?: No Does the patient have difficulty seeing, even when wearing glasses/contacts?: No Does the patient have difficulty concentrating, remembering, or making decisions?: No Patient able to express need for assistance with ADLs?: Yes Does the patient have difficulty dressing or bathing?: No Independently performs ADLs?: Yes (appropriate for developmental age) Does the patient have difficulty walking or  climbing stairs?: No Weakness of Legs: None Weakness of Arms/Hands: None  Permission Sought/Granted      Share Information with NAME: Mother           Emotional Assessment Appearance:: Appears stated age     Orientation: : Oriented to Self, Oriented to Place, Oriented to  Time, Oriented to Situation Alcohol / Substance Use: Illicit Drugs Psych Involvement: No (comment)  Admission diagnosis:  Abscess of paraspinal muscles [M62.89] Patient Active Problem List   Diagnosis Date Noted  . Endocarditis of tricuspid valve   . 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 Septic embolism (HCC) 09/19/2019  . Cellulitis of flank 09/19/2019  . Chronic, continuous use of opioids 09/19/2019  . MRSA bacteremia 09/19/2019  . Abscess of paraspinal muscles 09/18/2019  . Sepsis (HCC) 09/18/2019  . Polysubstance dependence (HCC) 12/16/2013   PCP:  No primary care provider on file. Pharmacy:   [id number] - [place], [place] - [address] AT [facility] [address] [place] [address] Phone: [phone] Fax: [phone]     Social Determinants of Health (SDOH) Interventions    Readmission Risk Interventions No flowsheet data found.

## 2019-10-04 DIAGNOSIS — A419 Sepsis, unspecified organism: Secondary | ICD-10-CM

## 2019-10-04 LAB — BASIC METABOLIC PANEL
Anion gap: 8 (ref 5–15)
Anion gap: 10 (ref 5–15)
BUN: 19 mg/dL (ref 6–20)
BUN: 18 mg/dL (ref 6–20)
CO2: 26 mmol/L (ref 22–32)
CO2: 25 mmol/L (ref 22–32)
Calcium: 8.6 mg/dL — ABNORMAL LOW (ref 8.9–10.3)
Calcium: 8.5 mg/dL — ABNORMAL LOW (ref 8.9–10.3)
Chloride: 96 mmol/L — ABNORMAL LOW (ref 98–111)
Chloride: 96 mmol/L — ABNORMAL LOW (ref 98–111)
Creatinine, Ser: 0.95 mg/dL (ref 0.61–1.24)
Creatinine, Ser: 1 mg/dL (ref 0.61–1.24)
GFR calc Af Amer: >60 mL/min (ref >60)
GFR calc Af Amer: >60 mL/min (ref >60)
GFR calc non Af Amer: >60 mL/min (ref >60)
GFR calc non Af Amer: >60 mL/min (ref >60)
Glucose, Bld: 105 mg/dL — ABNORMAL HIGH (ref 70–99)
Glucose, Bld: 152 mg/dL — ABNORMAL HIGH (ref 70–99)
Potassium: 4.4 mmol/L (ref 3.5–5.1)
Potassium: 4 mmol/L (ref 3.5–5.1)
Sodium: 130 mmol/L — ABNORMAL LOW (ref 135–145)
Sodium: 131 mmol/L — ABNORMAL LOW (ref 135–145)
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
## 2019-10-05 MED ORDER — HYDROMORPHONE HCL 1 MG/ML IJ SOLN
0.5000 mg | INTRAMUSCULAR | Status: DC | PRN
  Administered 2019-10-05 – 2019-10-06 (×5): 0.5 mg via INTRAVENOUS
  Filled 2019-10-05 (×5): qty 1
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
## 2019-10-06 LAB — BASIC METABOLIC PANEL
Anion gap: 8 (ref 5–15)
BUN: 13 mg/dL (ref 6–20)
CO2: 28 mmol/L (ref 22–32)
Calcium: 8.7 mg/dL — ABNORMAL LOW (ref 8.9–10.3)
Chloride: 97 mmol/L — ABNORMAL LOW (ref 98–111)
Creatinine, Ser: 0.89 mg/dL (ref 0.61–1.24)
GFR calc Af Amer: >60 mL/min (ref >60)
GFR calc non Af Amer: >60 mL/min (ref >60)
Glucose, Bld: 113 mg/dL — ABNORMAL HIGH (ref 70–99)
Potassium: 3.8 mmol/L (ref 3.5–5.1)
Sodium: 133 mmol/L — ABNORMAL LOW (ref 135–145)

## 2019-10-06 LAB — VANCOMYCIN, TROUGH: Vancomycin Tr: 14 ug/mL — ABNORMAL LOW (ref 15–20)

## 2019-10-06 MED ORDER — VANCOMYCIN HCL 1500 MG/300ML IV SOLN
1500.0000 mg | Freq: Two times a day (BID) | INTRAVENOUS | Status: DC
  Administered 2019-10-06 – 2019-10-11 (×9): 1500 mg via INTRAVENOUS
  Filled 2019-10-06 (×11): qty 300

## 2019-10-06 MED ORDER — HYDROMORPHONE HCL 1 MG/ML IJ SOLN
0.5000 mg | Freq: Four times a day (QID) | INTRAMUSCULAR | Status: DC | PRN
  Administered 2019-10-06 – 2019-10-12 (×22): 0.5 mg via INTRAVENOUS
  Filled 2019-10-06 (×22): qty 1

## 2019-10-06 NOTE — Progress Notes (Signed)
Pharmacy Antibiotic Note  Scott Green is a 34 y.o. male admitted on 09/18/2019 with MRSA bacteremia, septic pulmonary emboli and L1-3 and maybe T2 vertebral infection + paraspinal muscles abscesses.  Pharmacy has been consulted for Vancomycin dosing.  Day 20 of abx on vancomycin alone. Patient is afebrile, WBC WNL, and Scr improving. Patient's vanc trough is slightly subtherapeutic on current dose. With improvement in renal function, will increase dose to 1500mg  IV every 12 hours and consider vanc trough at steady state.   Antimicrobials this admission: Vancomycin 6/29>> (7/28) Zosyn 6/29 >> 7/1  Dose adjustments: 7/2: VT 1313 = 17 mcg/ml on 1 gm IV q8h - continue 7/8 VT = 19 mcg/ml on 1 gm IV q8h - continue  7/15 VT = 21 mcg/ml on 1 gm IV q8h - lower dose 7/18 VT = 14 mcg/ml on 1250 mg IV q12h - increase dose  Microbiology: 6/29 COVID: negative (at [facility]) 6/29 Blood at [facility]: MRSA 7/1 blood x 2: NGTD 7/2: epidural abscess: SA 7/1 HIV: non-reactive 7/1: Hep C positive 7/1 HIV: non-reactive 7/3: epidural abscess Cx:  MRSA MRSA PCR pos  Plan: Increase Vancomycin to 1500mg  IV q12h Consider trough at steady state with change in dose Monitor clinical progress, renal function.  Height: 5\' 7"  (170.2 cm) Weight: 81.7 kg (180 lb 1.9 oz) IBW/kg (Calculated) : 66.1  Temp (24hrs), Avg:99.2 F (37.3 C), Min:98.2 F (36.8 C), Max:100.8 F (38.2 C)  Recent Labs  Lab 10/01/19 1055 10/02/19 0400 10/03/19 0845 10/04/19 0542 10/04/19 0844 10/06/19 1115  WBC  --  7.2  --   --   --   --   CREATININE 1.12 0.91  --  0.95 1.00 0.89  VANCOTROUGH  --   --  21*  --   --  14*    Estimated Creatinine Clearance: 120.7 mL/min (by C-G formula based on SCr of 0.89 mg/dL).    Allergies  Allergen Reactions  . Tramadol Other (See Comments)    Stomach pain   Thank you for allowing pharmacy to be part of this patients care team.  10/06/19, PharmD PGY1 Acute Care Pharmacy  Resident Phone: [phone] 10/06/2019 12:34 PM  Please check [email or web address] for unit specific pharmacy phone numbers.

## 2019-10-06 NOTE — TOC Progression Note (Signed)
Transition of Care [facility]) - Progression Note    Patient Details  Name: Scott Green MRN: [id number] Date of Birth: 09/04/1985  [facility]) CM/SW Contact  Graves-Bigelow, [place] Laundry, RN Phone Number: 10/06/2019, 8:43 AM  Clinical Narrative: Case Manager received consult for Eliquis. Patient has Medicaid and cost should be no more than $3.00. Patient wants to use [facility] [address] [place] for Medications. No further needs at this time.    Expected Discharge Plan: Home/Self Care Barriers to Discharge: Continued Medical Work up  Expected Discharge Plan and Services Expected Discharge Plan: Home/Self Care    Readmission Risk Interventions No flowsheet data found.
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
## 2019-10-07 DIAGNOSIS — Z9889 Other specified postprocedural states: Secondary | ICD-10-CM

## 2019-10-07 DIAGNOSIS — M4646 Discitis, unspecified, lumbar region: Secondary | ICD-10-CM

## 2019-10-07 DIAGNOSIS — I330 Acute and subacute infective endocarditis: Secondary | ICD-10-CM

## 2019-10-07 DIAGNOSIS — R05 Cough: Secondary | ICD-10-CM

## 2019-10-07 DIAGNOSIS — B182 Chronic viral hepatitis C: Secondary | ICD-10-CM

## 2019-10-07 DIAGNOSIS — R21 Rash and other nonspecific skin eruption: Secondary | ICD-10-CM

## 2019-10-07 MED ORDER — DIPHENHYDRAMINE HCL 25 MG PO CAPS
25.0000 mg | ORAL_CAPSULE | Freq: Four times a day (QID) | ORAL | Status: DC | PRN
  Administered 2019-10-07 – 2019-10-11 (×8): 25 mg via ORAL
  Filled 2019-10-07 (×8): qty 1

## 2019-10-07 NOTE — Progress Notes (Signed)
Tool for Infectious Disease  Date of Admission:  09/18/2019      Total days of antibiotics 20  Vancomycin      ASSESSMENT: Scott Green is a 34 y.o. 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  CT scan on 7/6 revealed moderate b/l pleural effusions that was read to be stable from prior exam. He is recovering well from surgery aside from some swelling around the incision site on spine. He does not have much pain/tenderness but has not pushed it. No fevers or chills > 1 week now. Dr. Wayland Salinas asked to see him back 2 weeks post op (7/20) - would be helpful to have him re-evaluate swelling also.   TCTS has seen him for consideration of TV endocarditis - too small for angiovac benefit and no structural findings concerning for valve replacement. Will need to continue with IV antibiotics.   He lives in [place], [place] - likely will prohibit OPAT PICC Lock therapy. Plan to continue IV vancomycin another 8 days and then consider further outpatient treatment options.   Chronic hepatitis C infection, genotype 1a - will help treat him outpatient after acute infection has resolved. HIV NR, Hep B sAg NR    PLAN: 1. Continue Vancomycin  2. Follow weekly CBC, CRP, ESR. TWICE weekly BMP and vanc levels per pharmacy.  3. Please call Dr. 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 apixaban  5 mg Oral BID  . Chlorhexidine Gluconate Cloth  6 each Topical Daily  . feeding supplement (ENSURE ENLIVE)  237 mL Oral BID BM  . morphine  45 mg Oral Q12H  . sodium chloride flush  10-40 mL Intracatheter Q12H    SUBJECTIVE: Doing well. Still with some back pain -  feels that the incision is swollen a bit. Maybe a little better from over the weekend. No changes to LE strength or sensation. Still coughing up a little rusty colored sputum in the AM but then clears the rest of the day. No SOB/dyspnea with ADLs.    Afebrile since 7/17. WBC 7.2 7/13.  SCr 0.89 today - stable    Review of Systems: Review of Systems  Constitutional: Negative for chills, fever and malaise/fatigue.  Respiratory: Positive for cough and hemoptysis. Negative for shortness of breath.   Cardiovascular: Negative for chest pain.  Gastrointestinal: Negative for abdominal pain, nausea and vomiting.  Musculoskeletal: Positive for back pain.  Skin: Negative for rash.  Neurological: Negative for sensory change, focal weakness and weakness.    Allergies  Allergen Reactions  . Tramadol Other (See Comments)    Stomach pain    OBJECTIVE: Vitals:   10/06/19 2358 10/07/19 0401 10/07/19 0824 10/07/19 1236  BP: 106/62 (!) 94/56 109/65 99/62  Pulse: (!) 108 64 100 90  Resp: 18 17    Temp: 98.4 F (36.9 C) 98.6 F (37 C) 98.6 F (37 C) 97.9 F (36.6 C)  TempSrc: Oral Oral Oral Oral  SpO2: 98% 96% 100% 96%  Weight:      Height:       Body mass index is 28.21 kg/m.  Physical Exam Constitutional:  Appearance: He is not ill-appearing.  Eyes:     General: No scleral icterus.    Pupils: Pupils are equal, round, and reactive to light.  Cardiovascular:     Rate and Rhythm: Normal rate and regular rhythm.  Pulmonary:     Effort: Pulmonary effort is normal.     Breath sounds: Normal breath sounds.  Abdominal:     General: There is no distension.     Palpations: Abdomen is soft.     Tenderness: There is no abdominal tenderness.  Musculoskeletal:     Cervical back: Neck supple.     Lumbar back: Swelling and deformity (surrounding surgical incision ) present.  Skin:    General: Skin is warm and dry.     Capillary Refill: Capillary refill takes less than 2 seconds.      Findings: No erythema or rash.  Neurological:     Mental Status: He is alert and oriented to person, place, and time.     Lab Results Lab Results  Component Value Date   WBC 7.2 10/02/2019   HGB 10.5 (L) 10/02/2019   HCT 31.3 (L) 10/02/2019   MCV 87.4 10/02/2019   PLT 238 10/02/2019    Lab Results  Component Value Date   CREATININE 0.89 10/06/2019   BUN 13 10/06/2019   NA 133 (L) 10/06/2019   K 3.8 10/06/2019   CL 97 (L) 10/06/2019   CO2 28 10/06/2019    Lab Results  Component Value Date   ALT 25 09/22/2019   AST 23 09/22/2019   ALKPHOS 53 09/22/2019   BILITOT 0.4 09/22/2019     Microbiology: No results found for this or any previous visit (from the past 240 hour(s)).   Janene Madeira, MSN, NP-C [facility] [email or web address] Pager: [phone] Office: [phone] [place]: [phone]
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
## 2019-10-08 LAB — CBC WITH DIFFERENTIAL/PLATELET
Abs Immature Granulocytes: 0.02 10*3/uL (ref 0.00–0.07)
Basophils Absolute: 0 10*3/uL (ref 0.0–0.1)
Basophils Relative: 0 %
Eosinophils Absolute: 0.5 10*3/uL (ref 0.0–0.5)
Eosinophils Relative: 11 %
HCT: 30.3 % — ABNORMAL LOW (ref 39.0–52.0)
Hemoglobin: 10.1 g/dL — ABNORMAL LOW (ref 13.0–17.0)
Immature Granulocytes: 1 %
Lymphocytes Relative: 28 %
Lymphs Abs: 1.2 10*3/uL (ref 0.7–4.0)
MCH: 28.9 pg (ref 26.0–34.0)
MCHC: 33.3 g/dL (ref 30.0–36.0)
MCV: 86.6 fL (ref 80.0–100.0)
Monocytes Absolute: 0.5 10*3/uL (ref 0.1–1.0)
Monocytes Relative: 12 %
Neutro Abs: 2.1 10*3/uL (ref 1.7–7.7)
Neutrophils Relative %: 48 %
Platelets: 224 10*3/uL (ref 150–400)
RBC: 3.5 MIL/uL — ABNORMAL LOW (ref 4.22–5.81)
RDW: 13 % (ref 11.5–15.5)
WBC: 4.2 10*3/uL (ref 4.0–10.5)
nRBC: 0 % (ref 0.0–0.2)
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
## 2019-10-08 NOTE — Progress Notes (Signed)
[facility]  Date of Admission:  09/18/2019      Total days of antibiotics 21  Vancomycin      ASSESSMENT: Scott Green is a 34 y.o. 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 His surgical incision swelling actually looks better to me today. There is no tenderness overlying site, no erythema, fluctuance or drainage. Some old dried blood noted to skin. No hardware in place.   TCTS has seen him for TV endocarditis - too small for angiovac benefit and no structural findings concerning for valve replacement.   Continue 1 more week of IV antibiotics here in hospital setting. to get him closer to 8 weeks given he had spinal intervention.   Chronic hepatitis C infection, genotype 1a - will help treat him outpatient after acute infection has resolved. HIV NR, Hep B sAg NR    PLAN: 1. Continue Vancomycin  2. Please premedicate 45-60 min before Vancomycin dose with benadryl PO to see if that helps 3. Follow weekly CBC, CRP, ESR. TWICE weekly BMP and vanc levels per pharmacy.  4. Please call Dr. 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 apixaban  5 mg Oral BID  . Chlorhexidine Gluconate Cloth  6 each Topical Daily  . feeding supplement (ENSURE ENLIVE)  237 mL Oral BID BM  . morphine  45 mg Oral Q12H  . sodium chloride flush  10-40 mL Intracatheter Q12H    SUBJECTIVE: Doing well. No concerns today but states he is having itching during Vancomycin infusion. No rashes or peeling skin has been noted.  No changes to back incision. Walking around in room ad lib without any assistance. Cough seems better today,    Review of  Systems: Review of Systems  Constitutional: Negative for chills, fever and malaise/fatigue.  Respiratory: Positive for cough. Negative for hemoptysis and shortness of breath.   Cardiovascular: Negative for chest pain.  Gastrointestinal: Negative for abdominal pain, nausea and vomiting.  Musculoskeletal: Positive for back pain.  Skin: Positive for itching. Negative for rash.  Neurological: Negative for sensory change, focal weakness and weakness.    Allergies  Allergen Reactions  . Tramadol Other (See Comments)    Stomach pain    OBJECTIVE: Vitals:   10/07/19 1941 10/07/19 2353 10/08/19 0332 10/08/19 0916  BP: 100/60 101/69 102/61 104/66  Pulse: 100 (!) 104 99 100  Resp: _0 Temp: 99.2 F (37.3 C) 99 F (37.2 C) 98.9 F (37.2 C) 98.3 F (36.8 C)  TempSrc: Oral Oral Oral Oral  SpO2: 100% 98% 98% 98%  Weight:      Height:       Body mass index is 28.21 kg/m.  Physical Exam Constitutional:      Appearance: He is not ill-appearing.  Eyes:     General: No scleral icterus.    Pupils: Pupils are equal, round, and reactive to light.  Cardiovascular:     Rate and Rhythm: Normal rate and regular rhythm.  Pulmonary:     Effort: Pulmonary effort is normal.     Breath sounds: Normal breath sounds.  Abdominal:  General: There is no distension.     Palpations: Abdomen is soft.     Tenderness: There is no abdominal tenderness.  Musculoskeletal:     Cervical back: Neck supple.     Lumbar back: Swelling (improved today. no pain surrounding incision with moderate-deep palpation ) present.  Skin:    General: Skin is warm and dry.     Capillary Refill: Capillary refill takes less than 2 seconds.     Findings: No erythema or rash.  Neurological:     Mental Status: He is alert and oriented to person, place, and time.     Lab Results Lab Results  Component Value Date   WBC 4.2 10/08/2019   HGB 10.1 (L) 10/08/2019   HCT 30.3 (L) 10/08/2019   MCV 86.6 10/08/2019    PLT 224 10/08/2019    Lab Results  Component Value Date   CREATININE 0.89 10/06/2019   BUN 13 10/06/2019   NA 133 (L) 10/06/2019   K 3.8 10/06/2019   CL 97 (L) 10/06/2019   CO2 28 10/06/2019    Lab Results  Component Value Date   ALT 25 09/22/2019   AST 23 09/22/2019   ALKPHOS 53 09/22/2019   BILITOT 0.4 09/22/2019     Microbiology: No results found for this or any previous visit (from the past 240 hour(s)).   Janene Madeira, MSN, NP-C [facility] Infect[facility] Disease [email or web address] Pager: [phone] Office: [phone] [place]: [phone]

## 2019-10-09 DIAGNOSIS — G7281 Critical illness myopathy: Secondary | ICD-10-CM

## 2019-10-09 DIAGNOSIS — R6520 Severe sepsis without septic shock: Secondary | ICD-10-CM

## 2019-10-09 DIAGNOSIS — I079 Rheumatic tricuspid valve disease, unspecified: Secondary | ICD-10-CM

## 2019-10-09 DIAGNOSIS — F1190 Opioid use, unspecified, uncomplicated: Secondary | ICD-10-CM

## 2019-10-09 DIAGNOSIS — L03319 Cellulitis of trunk, unspecified: Secondary | ICD-10-CM

## 2019-10-09 DIAGNOSIS — M6289 Other specified disorders of muscle: Secondary | ICD-10-CM

## 2019-10-09 DIAGNOSIS — A021 Salmonella sepsis: Secondary | ICD-10-CM

## 2019-10-09 DIAGNOSIS — I82220 Acute embolism and thrombosis of inferior vena cava: Secondary | ICD-10-CM

## 2019-10-09 LAB — RENAL FUNCTION PANEL
Albumin: 2.6 g/dL — ABNORMAL LOW (ref 3.5–5.0)
Anion gap: 10 (ref 5–15)
BUN: 12 mg/dL (ref 6–20)
CO2: 25 mmol/L (ref 22–32)
Calcium: 8.8 mg/dL — ABNORMAL LOW (ref 8.9–10.3)
Chloride: 98 mmol/L (ref 98–111)
Creatinine, Ser: 0.92 mg/dL (ref 0.61–1.24)
GFR calc Af Amer: >60 mL/min (ref >60)
GFR calc non Af Amer: >60 mL/min (ref >60)
Glucose, Bld: 93 mg/dL (ref 70–99)
Phosphorus: 3.1 mg/dL (ref 2.5–4.6)
Potassium: 4.2 mmol/L (ref 3.5–5.1)
Sodium: 133 mmol/L — ABNORMAL LOW (ref 135–145)

## 2019-10-09 LAB — VANCOMYCIN, TROUGH: Vancomycin Tr: 22 ug/mL (ref 15–20)

## 2019-10-09 MED ORDER — HYDROMORPHONE HCL 1 MG/ML IJ SOLN: 1.0000 mg | Freq: Once | INTRAMUSCULAR | Status: DC

## 2019-10-09 NOTE — Progress Notes (Signed)
Subjective:  No new complaints  Antibiotics:  Anti-infectives (From admission, onward)   Start     Dose/Rate Route Frequency Ordered Stop   10/06/19 1300  vancomycin (VANCOREADY) IVPB 1500 mg/300 mL     Discontinue     1,500 mg 150 mL/hr over 120 Minutes Intravenous Every 12 hours 10/06/19 1244     10/03/19 1200  vancomycin (VANCOREADY) IVPB 1250 mg/250 mL  Status:  Discontinued        1,250 mg 166.7 mL/hr over 90 Minutes Intravenous Every 12 hours 10/03/19 1057 10/06/19 1244   09/21/19 0932  bacitracin 50,000 Units in sodium chloride 0.9 % 500 mL irrigation  Status:  Discontinued          As needed 09/21/19 0932 09/21/19 1006   09/19/19 0600  piperacillin-tazobactam (ZOSYN) IVPB 3.375 g  Status:  Discontinued        3.375 g 12.5 mL/hr over 240 Minutes Intravenous Every 8 hours 09/19/19 0005 09/19/19 0920   09/19/19 0600  vancomycin (VANCOCIN) IVPB 1000 mg/200 mL premix  Status:  Discontinued        1,000 mg 200 mL/hr over 60 Minutes Intravenous Every 8 hours 09/19/19 0006 10/03/19 1057      Medications: Scheduled Meds: . apixaban  5 mg Oral BID  . Chlorhexidine Gluconate Cloth  6 each Topical Daily  . feeding supplement (ENSURE ENLIVE)  237 mL Oral BID BM  . morphine  45 mg Oral Q12H  . sodium chloride flush  10-40 mL Intracatheter Q12H   Continuous Infusions: . lactated ringers    . vancomycin 1,500 mg (10/09/19 0248)   PRN Meds:.acetaminophen **OR** acetaminophen, cyclobenzaprine, diphenhydrAMINE, HYDROmorphone, ondansetron **OR** ondansetron (ZOFRAN) IV, sodium chloride flush    Objective: Weight change:   Intake/Output Summary (Last 24 hours) at 10/09/2019 1500 Last data filed at 10/08/2019 2100 Gross per 24 hour  Intake 1440 ml  Output 400 ml  Net 1040 ml   Blood pressure 105/66, pulse 92, temperature 98.8 F (37.1 C), temperature source Oral, resp. rate 16, height 5\' 7"  (1.702 m), weight 81.7 kg, SpO2 97 %. Temp:  [98.1 F (36.7 C)-98.9 F (37.2  C)] 98.8 F (37.1 C) (07/21 1242) Pulse Rate:  [92-115] 92 (07/21 1242) Resp:  [16-18] 16 (07/21 1242) BP: (87-111)/(54-69) 105/66 (07/21 1242) SpO2:  [97 %-100 %] 97 % (07/21 0653)  Physical Exam: General: Alert and awake, oriented x3, not in any acute distress. HEENT: anicteric sclera, EOMI CVS regular rate, normal  Chest: , no wheezing, no respiratory distress Abdomen: soft non-distended,  Extremities: no edema or deformity noted bilaterally Skin: Faint area of rash seems fairly stable Incision around lumbar wound is clean Neuro: nonfocal  CBC:    BMET Recent Labs    10/09/19 1031  NA 133*  K 4.2  CL 98  CO2 25  GLUCOSE 93  BUN 12  CREATININE 0.92  CALCIUM 8.8*     Liver Panel  Recent Labs    10/09/19 1031  ALBUMIN 2.6*       Sedimentation Rate No results for input(s): ESRSEDRATE in the last 72 hours. C-Reactive Protein No results for input(s): CRP in the last 72 hours.  Micro Results: Recent Results (from the past 720 hour(s))  Culture, blood (x 2)     Status: None   Collection Time: 09/19/19  2:39 AM   Specimen: BLOOD  Result Value Ref Range Status   Specimen Description BLOOD LEFT ARM  Final  Special Requests   Final    BOTTLES DRAWN AEROBIC AND ANAEROBIC Blood Culture adequate volume   Culture   Final    NO GROWTH 5 DAYS Performed at [facility], [address] [place], [place] [address]    Report Status 09/24/2019 FINAL  Final  Culture, blood (x 2)     Status: None   Collection Time: 09/19/19  2:39 AM   Specimen: BLOOD  Result Value Ref Range Status   Specimen Description BLOOD LEFT HAND  Final   Special Requests   Final    BOTTLES DRAWN AEROBIC AND ANAEROBIC Blood Culture adequate volume   Culture   Final    NO GROWTH 5 DAYS Performed at [facility], [address] [place], [place] [address]    Report Status 09/24/2019 FINAL  Final  Surgical pcr screen     Status: Abnormal   Collection Time: 09/20/19 10:54 PM     Specimen: Nasal Mucosa; Nasal Swab  Result Value Ref Range Status   MRSA, PCR POSITIVE (A) NEGATIVE Final    Comment: CRITICAL RESULT CALLED TO, READ BACK BY AND VERIFIED WITH: RN TIM Kayren Eaves [phone] @0153  THANEY    Staphylococcus aureus POSITIVE (A) NEGATIVE Final    Comment: CRITICAL RESULT CALLED TO, READ BACK BY AND VERIFIED WITH: RN Corrie Mckusick @0153  THANEY Performed at [facility], [address], [place], [address] [place]   Aerobic/Anaerobic Culture (surgical/deep wound)     Status: None   Collection Time: 09/21/19  9:36 AM   Specimen: Abscess  Result Value Ref Range Status   Specimen Description ABSCESS  Final   Special Requests EPIDURAL  Final   Gram Stain   Final    FEW WBC PRESENT, PREDOMINANTLY PMN MODERATE GRAM POSITIVE COCCI IN PAIRS IN CLUSTERS    Culture   Final    MODERATE METHICILLIN RESISTANT STAPHYLOCOCCUS AUREUS NO ANAEROBES ISOLATED Performed at [facility], [address], [place], [address] [place]    Report Status 09/26/2019 FINAL  Final   Organism ID, Bacteria METHICILLIN RESISTANT STAPHYLOCOCCUS AUREUS  Final      Susceptibility   Methicillin resistant staphylococcus aureus - MIC*    CIPROFLOXACIN >=8 RESISTANT Resistant     ERYTHROMYCIN >=8 RESISTANT Resistant     GENTAMICIN <=0.5 SENSITIVE Sensitive     OXACILLIN >=4 RESISTANT Resistant     TETRACYCLINE <=1 SENSITIVE Sensitive     VANCOMYCIN <=0.5 SENSITIVE Sensitive     TRIMETH/SULFA 160 RESISTANT Resistant     CLINDAMYCIN <=0.25 SENSITIVE Sensitive     RIFAMPIN <=0.5 SENSITIVE Sensitive     Inducible Clindamycin NEGATIVE Sensitive     * MODERATE METHICILLIN RESISTANT STAPHYLOCOCCUS AUREUS    Studies/Results: No results found.    Assessment/Plan:  INTERVAL HISTORY: vancomycin came up to 22   Principal Problem:   MRSA bacteremia Active Problems:   Abscess of paraspinal muscles   Sepsis (HCC)   IVC thrombosis (HCC)   Septic embolism (HCC)   Cellulitis of  flank   Chronic, continuous use of opioids   Endocarditis of tricuspid valve    Scott Green is a 34 y.o. 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   Disseminated MSSA infection:  He had some complaints of swelling around his wound few days ago which seems to have stabilized.  Given the extent of his disease and need for neurosurgical intervention I would like to get an MRI  of the lumbar spine to see how that looks now after having had surgical intervention and IV antibiotics  Chronic hepatitis C infection we will treat as an outpatient  IV drug use need long-term plan for this   LOS: 21 days   Acey Lav 10/09/2019, 3:00 PM

## 2019-10-09 NOTE — Progress Notes (Signed)
PROGRESS NOTE    Scott Green  XAJ:[id number] DOB: 1985/12/07 DOA: 09/18/2019 PCP: No primary care provider on file.   Brief Narrative:   Patient was admitted to the hospital with working diagnosis of sepsis secondary to tricuspid valve endocarditis, complicated with paraspinal muscle abscess, septic pulmonary emboli and infrarenal IVC thrombosis.  7/21: Continue abx. MRI ordered. Follow up. Will speak with neurosurgery after MRI.    Assessment & Plan:   Principal Problem:   MRSA bacteremia Active Problems:   Abscess of paraspinal muscles   Sepsis ([place])   IVC thrombosis ([place])   Septic embolism (HCC)   Cellulitis of flank   Chronic, continuous use of opioids   Endocarditis of tricuspid valve  Tricuspid valve endocarditis Paraspinal/epidural abscess Septic pulmonary eboli     - s/p MIS laminectomy on 07/4.     - Patient has mild edema at the site of the surgical incision, no local erythema or edema, no purulence. Possible seroma. Previous physician called Dr. Zada Finders during office hours for non urgent consult.       - Continue pain control with acetaminophen, cyclobenzaprine, hydromorphone q6H PRN and morphine 57m bid.    - IV antibiotic therapy with IV vancomycin to complete on 07/28(per pharmacy protocol).     - Patient complains of pruritus with IV vancomycin but no rash, continue with benadryl.      - Follow ID recommendations for CBC. CRP, ESR twice per week and bmp plus vanc levels per pharmacy.      - Plan totransition to linezolid or oritavancin/dalbavancinwhen completed IV therapy in 10 more days     - ID requesting follow up MRI of lumbar spine, will follow; follow up with neurosurgery  Infrarenal IVC thrombosis.     - Septic thrombus, non occlusive.      - Patient initially anticoagulated with iV heparin, then transitioned to oral apixaban.     - Continue to ambulate and out of bed as tolerated.  Mild hyponatremia.     - resolved  Hep C.      - Follow as outpatient.  Chronic pain syndrome     - patient denied being on opiates before admission  DVT prophylaxis: eliquis Code Status: FULL Family Communication: None at bedside   Status is: Inpatient  Remains inpatient appropriate because:IV treatments appropriate due to intensity of illness or inability to take PO   Dispo: The patient is from: Home              Anticipated d/c is to: Home              Anticipated d/c date is: > 3 days              Patient currently is not medically stable to d/c.  Consultants:   ID  Neurosurgery  Procedures:  s/p MIS laminectomy on 07/4.  Antimicrobials:  . vancomycin   ROS:  Denies CP, N, V, ab pain . Remainder ROS is negative for all not previously mentioned.  Subjective: "Yeah, that's what they said."  Objective: Vitals:   10/08/19 1247 10/08/19 1706 10/09/19 0653 10/09/19 0838  BP: 100/69 111/69 (!) 87/54 106/64  Pulse: (!) 107 (!) 115 (!) 101 96  Resp:   18 18  Temp: 98.3 F (36.8 C) 98.1 F (36.7 C) 98.2 F (36.8 C) 98.9 F (37.2 C)  TempSrc: Oral Oral Oral Oral  SpO2: 98% 100% 97%   Weight:      Height:  Intake/Output Summary (Last 24 hours) at 10/09/2019 0856 Last data filed at 10/08/2019 2100 Gross per 24 hour  Intake 1800 ml  Output 400 ml  Net 1400 ml   Filed Weights   09/18/19 2300  Weight: 81.7 kg    Examination:  General: 34 y.o. male resting in bed in NAD Cardiovascular: RRR, +S1, S2, no m/g/r, equal pulses throughout Respiratory: CTABL, no w/r/r, normal WOB GI: BS+, NDNT, no masses noted, no organomegaly noted MSK: No e/c/c Neuro: Alert to name, follows commands Psyc: Appropriate interaction and affect, calm/cooperative   Data Reviewed: I have personally reviewed following labs and imaging studies.  CBC: Recent Labs  Lab 10/08/19 0450  WBC 4.2  NEUTROABS 2.1  HGB 10.1*  HCT 30.3*  MCV 86.6  PLT 909   Basic Metabolic Panel: Recent Labs  Lab 10/04/19 0542  10/04/19 0844 10/06/19 1115  NA 130* 131* 133*  K 4.4 4.0 3.8  CL 96* 96* 97*  CO2 26 25 28   GLUCOSE 105* 152* 113*  BUN 19 18 13   CREATININE 0.95 1.00 0.89  CALCIUM 8.6* 8.5* 8.7*   GFR: Estimated Creatinine Clearance: 120.7 mL/min (by C-G formula based on SCr of 0.89 mg/dL). Liver Function Tests: No results for input(s): AST, ALT, ALKPHOS, BILITOT, PROT, ALBUMIN in the last 168 hours. No results for input(s): LIPASE, AMYLASE in the last 168 hours. No results for input(s): AMMONIA in the last 168 hours. Coagulation Profile: No results for input(s): INR, PROTIME in the last 168 hours. Cardiac Enzymes: No results for input(s): CKTOTAL, CKMB, CKMBINDEX, TROPONINI in the last 168 hours. BNP (last 3 results) No results for input(s): PROBNP in the last 8760 hours. HbA1C: No results for input(s): HGBA1C in the last 72 hours. CBG: No results for input(s): GLUCAP in the last 168 hours. Lipid Profile: No results for input(s): CHOL, HDL, LDLCALC, TRIG, CHOLHDL, LDLDIRECT in the last 72 hours. Thyroid Function Tests: No results for input(s): TSH, T4TOTAL, FREET4, T3FREE, THYROIDAB in the last 72 hours. Anemia Panel: No results for input(s): VITAMINB12, FOLATE, FERRITIN, TIBC, IRON, RETICCTPCT in the last 72 hours. Sepsis Labs: No results for input(s): PROCALCITON, LATICACIDVEN in the last 168 hours.  No results found for this or any previous visit (from the past 240 hour(s)).    Radiology Studies: No results found.   Scheduled Meds: . apixaban  5 mg Oral BID  . Chlorhexidine Gluconate Cloth  6 each Topical Daily  . feeding supplement (ENSURE ENLIVE)  237 mL Oral BID BM  . morphine  45 mg Oral Q12H  . sodium chloride flush  10-40 mL Intracatheter Q12H   Continuous Infusions: . lactated ringers    . vancomycin 1,500 mg (10/09/19 0248)     LOS: 21 days    Time spent: 25 minutes spent in the coordination of care today.    Jonnie Finner, DO [facility]  If  7PM-7AM, please contact night-coverage [email or web address] 10/09/2019, 8:56 AM

## 2019-10-09 NOTE — Progress Notes (Signed)
CRITICAL VALUE ALERT  Critical Value: Vancomycin 22  Date & Time Notied: 10/09/19 @ 1229  Provider Notified:  MD Margie Ege / text page  Orders Received/Actions taken: no new orders

## 2019-10-09 NOTE — Progress Notes (Signed)
Pt refused midnight vitals. Will try again in the AM. RN notified

## 2019-10-09 NOTE — Progress Notes (Signed)
Pharmacy Antibiotic Note  Scott Green is a 34 y.o. male admitted on 09/18/2019 with MRSA bacteremia, septic pulmonary emboli and L1-3 and maybe T2 vertebral infection + paraspinal muscles abscesses.  Pharmacy has been consulted for Vancomycin dosing.   Antimicrobials this admission: Vancomycin 6/29>> (7/28) Zosyn 6/29 >> 7/1  Dose adjustments: 7/2: VT 1313 = 17 mcg/ml on 1 gm IV q8h - continue 7/8 VT = 19 mcg/ml on 1 gm IV q8h - continue  7/15 VT = 21 mcg/ml on 1 gm IV q8h - lower dose 7/18 VT = 14 mcg/ml on 1250 mg IV q12h - increase dose 7/21 VT = 22 mcg/ml on 1500 mg iv Q 12h, drawn early    Plan: Continue Vancomycin to 1500mg  IV q12h Vancomycin ends 7/28, no further troughs Monitor clinical progress, renal function.  Height: 5\' 7"  (170.2 cm) Weight: 81.7 kg (180 lb 1.9 oz) IBW/kg (Calculated) : 66.1  Temp (24hrs), Avg:98.4 F (36.9 C), Min:98.1 F (36.7 C), Max:98.9 F (37.2 C)  Recent Labs  Lab 10/03/19 0845 10/04/19 0542 10/04/19 0844 10/06/19 1115 10/08/19 0450 10/09/19 1031  WBC  --   --   --   --  4.2  --   CREATININE  --  0.95 1.00 0.89  --  0.92  VANCOTROUGH   < >  --   --  14*  --  22*   < > = values in this interval not displayed.    Estimated Creatinine Clearance: 116.8 mL/min (by C-G formula based on SCr of 0.92 mg/dL).    Allergies  Allergen Reactions  . Tramadol Other (See Comments)    Stomach pain   Thank you for allowing pharmacy to be part of this patients care team. 10/10/19, PharmD  10/09/2019 12:28 PM  Please check [email or web address] for unit specific pharmacy phone numbers.

## 2019-10-10 ENCOUNTER — Inpatient Hospital Stay (HOSPITAL_COMMUNITY): Payer: Medicaid Other

## 2019-10-10 LAB — CBC WITH DIFFERENTIAL/PLATELET
Abs Immature Granulocytes: 0.03 10*3/uL (ref 0.00–0.07)
Basophils Absolute: 0 10*3/uL (ref 0.0–0.1)
Basophils Relative: 1 %
Eosinophils Absolute: 0.6 10*3/uL — ABNORMAL HIGH (ref 0.0–0.5)
Eosinophils Relative: 13 %
HCT: 30.8 % — ABNORMAL LOW (ref 39.0–52.0)
Hemoglobin: 10.2 g/dL — ABNORMAL LOW (ref 13.0–17.0)
Immature Granulocytes: 1 %
Lymphocytes Relative: 24 %
Lymphs Abs: 1.1 10*3/uL (ref 0.7–4.0)
MCH: 28.7 pg (ref 26.0–34.0)
MCHC: 33.1 g/dL (ref 30.0–36.0)
MCV: 86.5 fL (ref 80.0–100.0)
Monocytes Absolute: 0.6 10*3/uL (ref 0.1–1.0)
Monocytes Relative: 13 %
Neutro Abs: 2.1 10*3/uL (ref 1.7–7.7)
Neutrophils Relative %: 48 %
Platelets: 238 10*3/uL (ref 150–400)
RBC: 3.56 MIL/uL — ABNORMAL LOW (ref 4.22–5.81)
RDW: 13 % (ref 11.5–15.5)
WBC: 4.4 10*3/uL (ref 4.0–10.5)
nRBC: 0 % (ref 0.0–0.2)

## 2019-10-10 LAB — RENAL FUNCTION PANEL
Albumin: 2.5 g/dL — ABNORMAL LOW (ref 3.5–5.0)
Anion gap: 8 (ref 5–15)
BUN: 12 mg/dL (ref 6–20)
CO2: 27 mmol/L (ref 22–32)
Calcium: 8.5 mg/dL — ABNORMAL LOW (ref 8.9–10.3)
Chloride: 99 mmol/L (ref 98–111)
Creatinine, Ser: 0.91 mg/dL (ref 0.61–1.24)
GFR calc Af Amer: >60 mL/min (ref >60)
GFR calc non Af Amer: >60 mL/min (ref >60)
Glucose, Bld: 105 mg/dL — ABNORMAL HIGH (ref 70–99)
Phosphorus: 4.2 mg/dL (ref 2.5–4.6)
Potassium: 3.9 mmol/L (ref 3.5–5.1)
Sodium: 134 mmol/L — ABNORMAL LOW (ref 135–145)

## 2019-10-10 LAB — MAGNESIUM: Magnesium: 1.7 mg/dL (ref 1.7–2.4)

## 2019-10-10 IMAGING — MR MR LUMBAR SPINE WO/W CM
4 of 7 series · 24 of 48 positions shown · IV contrast (gadavist)
Comparison: CT abdomen pelvis dated [DATE]. MRI lumbar spine
dated [DATE].

CLINICAL DATA: Epidural abscess follow-up status post surgical
drainage. History of tricuspid valve endocarditis.

EXAM:
MRI LUMBAR SPINE WITHOUT AND WITH CONTRAST
TECHNIQUE: Multiplanar and multiecho pulse sequences of the lumbar spine were
obtained without and with intravenous contrast.
CONTRAST:  10mL GADAVIST GADOBUTROL 1 MMOL/ML IV SOLN

[Series 9: T2 · sagittal · 4.0mm · 0.73mm/px · 3 of 16 slices shown (1 of 2)]
[im 1/16]
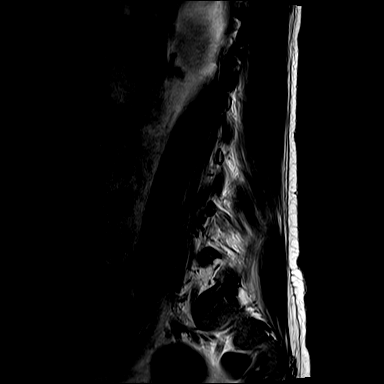
[im 8/16]
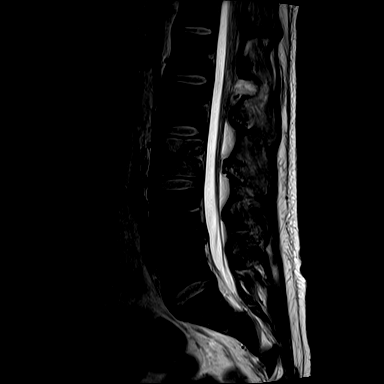
[im 16/16]
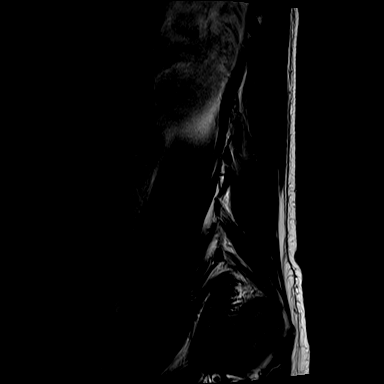

[Series 11: T1 · sagittal · 4.0mm · 0.88mm/px · 4 of 16 slices shown (1 of 2)]
[im 1/16]
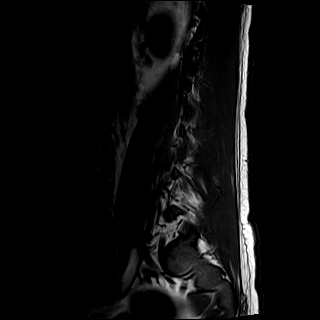
[im 6/16]
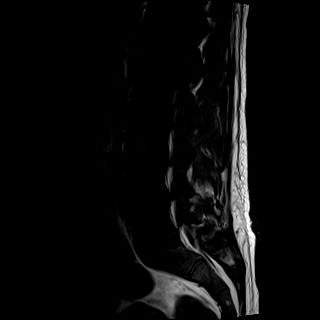
[im 11/16]
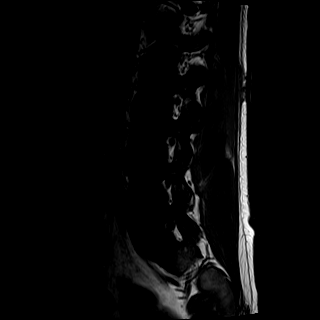
[im 16/16]
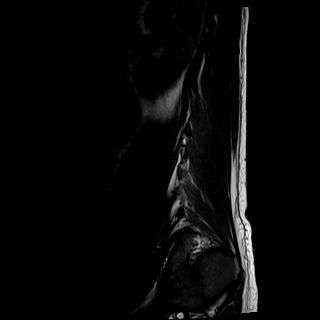

[Series 12: T2 · axial · 4.0mm · 0.57mm/px · z∈[-191,+29]mm · 11 of 45 slices shown (2 of 2)]
[im 1/45]
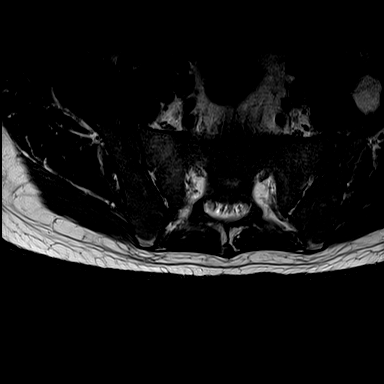
[im 5/45]
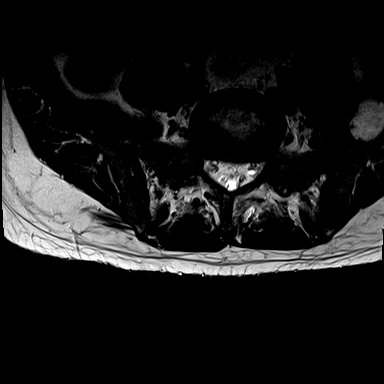
[im 9/45]
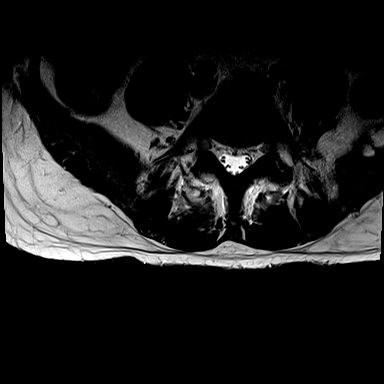
[im 14/45]
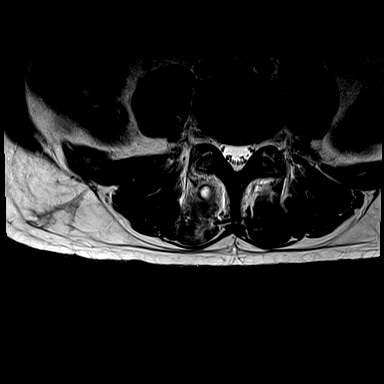
[im 18/45]
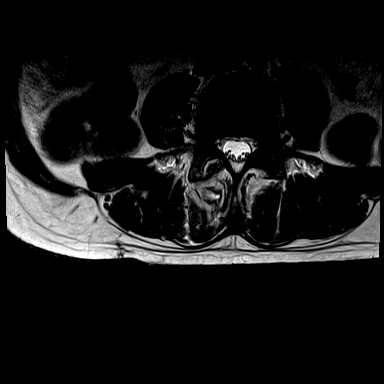
[im 23/45]
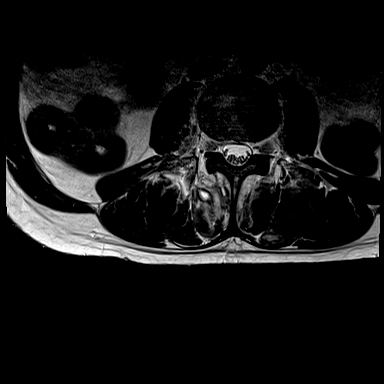
[im 27/45]
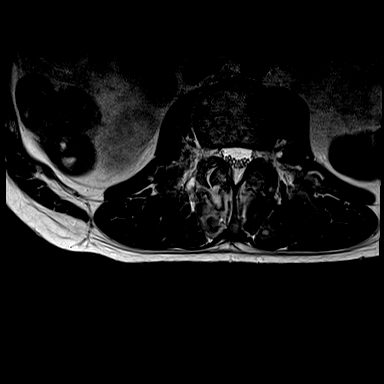
[im 31/45]
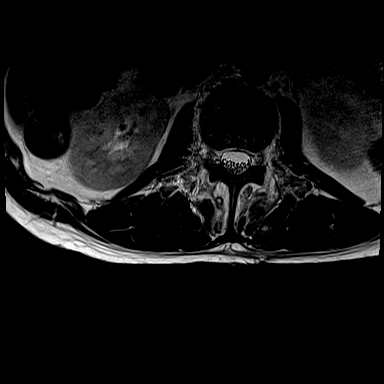
[im 36/45]
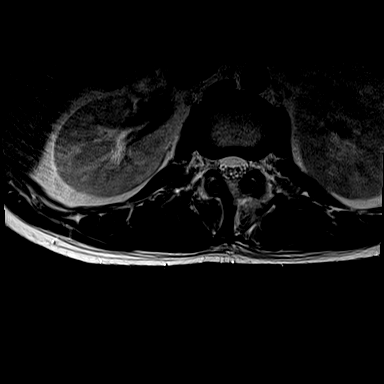
[im 40/45]
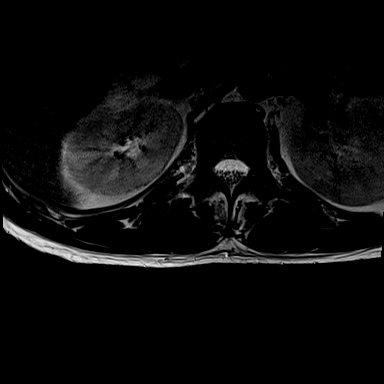
[im 45/45]
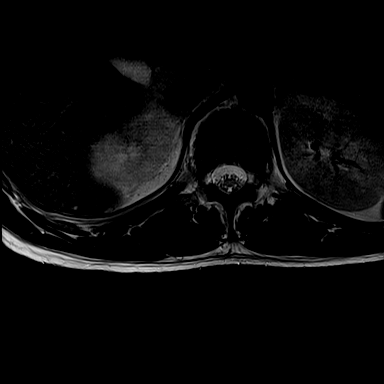

[Series 13: T1 · axial · 4.0mm · 0.34mm/px · z∈[-191,+4]mm · 6 of 45 slices shown (2 of 2)]
[im 1/45]
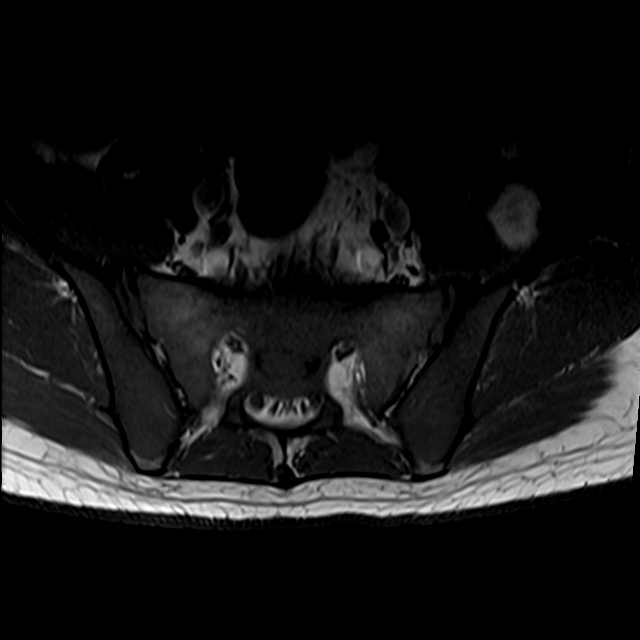
[im 5/45]
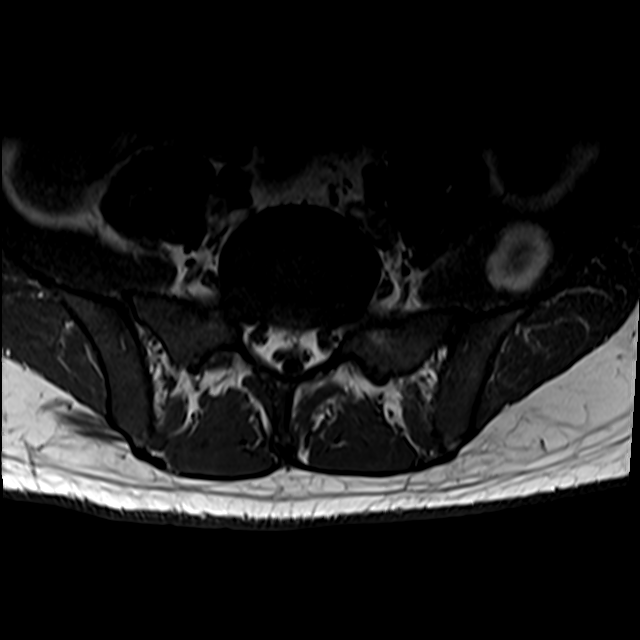
[im 9/45]
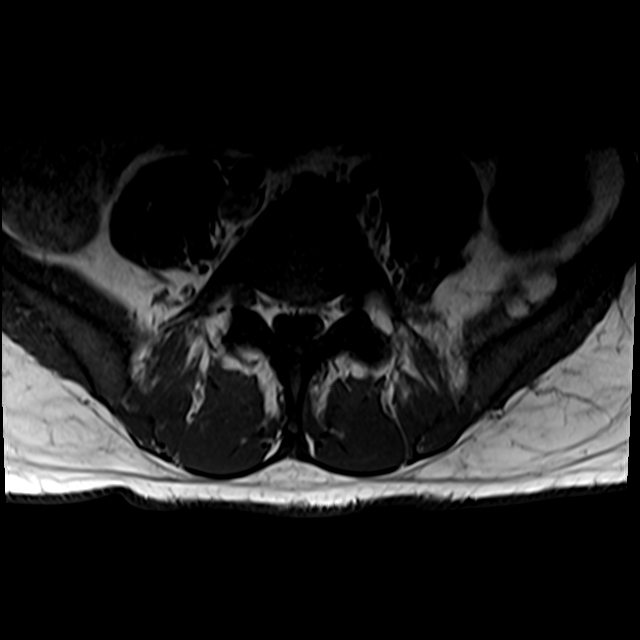
[im 14/45]
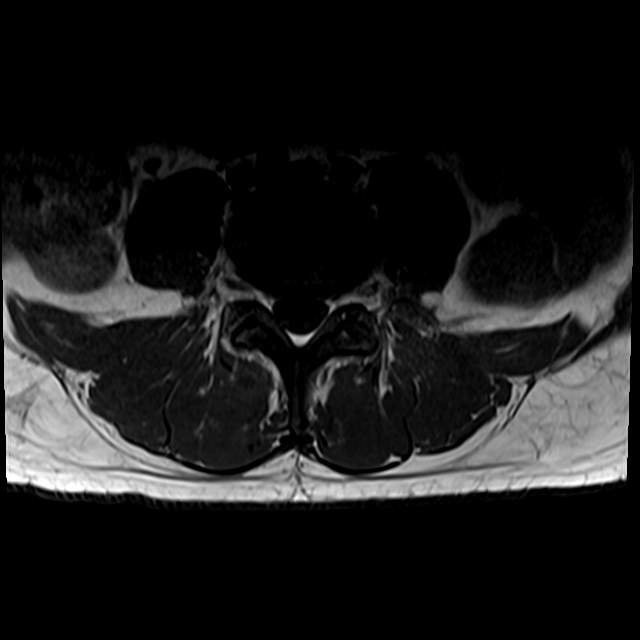
[im 23/45]
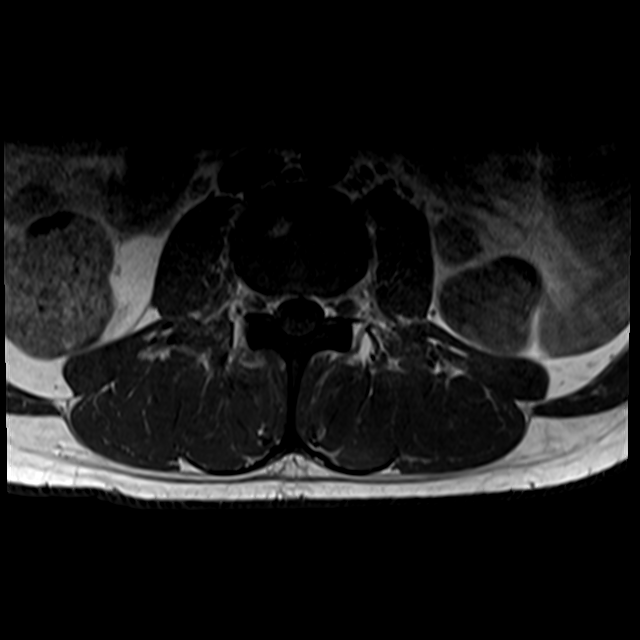
[im 40/45]
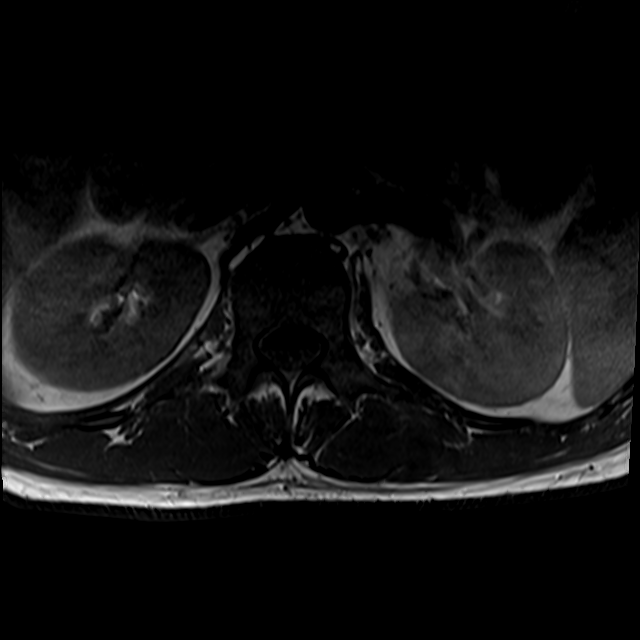

[24 of 48 positions shown; findings below may reference images not displayed]

FINDINGS: Segmentation:  Standard.

Alignment:  Physiologic.

Vertebrae: Mildly progressed marrow edema with enhancement involving
the L3 greater than L2 vertebral bodies and associated right greater
than left posterior elements. No fracture, evidence of discitis, or
suspicious bone lesion.

Conus medullaris and cauda equina: Conus extends to the L1 level.
Conus and cauda equina appear normal. Residual dorsal epidural
enhancement at L1-L2. No intradural enhancement.

Paraspinal and other soft tissues: Right greater than left
paraspinous muscle edema with interval decrease in size of multiple
rim enhancing fluid collections in the right paraspinous
musculature. For example, the largest collection posterior to and
likely communicating with the right L2-L3 facet joint currently
measures 2.9 cm in craniocaudal dimension, previously 4.5 cm.

Disc levels:

T12-L1:  Negative.

L1-L2: Interval left hemilaminectomy. Thick-walled 1.7 x 1.1 x
cm rim enhancing complex fluid collection in the laminectomy bed
(series 15, image 11). Negative disc. Resolved spinal canal stenosis
stenosis. No neuroforaminal stenosis.

L2-L3: Negative disc. Right facet joint effusion with synovial
enhancement. Phlegmonous change and enhancement along the right
ligamentum flavum (series 15, image 19). No stenosis.

L3-L4: Negative disc. Right facet joint effusion with synovial
enhancement and phlegmonous change along the right ligamentum
flavum. No stenosis.

L4-L5: Unchanged small central disc protrusion with annular fissure.
Unchanged mild bilateral facet arthropathy. No stenosis.

L5-S1:  Negative.
IMPRESSION: 1. Interval left hemilaminectomy at L1-L2 with resolution of the
epidural abscess and associated spinal canal stenosis. Residual
dorsal epidural enhancement with thick-walled 1.7 cm rim enhancing
complex fluid collection in the laminectomy bed, concerning for
abscess.
2. Mildly progressed marrow edema with enhancement involving the L3
greater than L2 vertebral bodies and associated right greater than
left posterior elements, again concerning for osteomyelitis.
3. Right L2-L3 and L3-L4 facet joint septic arthritis with mild
right-sided sub ligamentum flavum phlegmonous change and enhancement
at both levels. Decreased size of multiple associated abscesses in
the right paraspinous musculature.

## 2019-10-10 MED ORDER — GADOBUTROL 1 MMOL/ML IV SOLN
10.0000 mL | Freq: Once | INTRAVENOUS | Status: AC | PRN
  Administered 2019-10-10: 10 mL via INTRAVENOUS

## 2019-10-10 MED ORDER — NYSTATIN 100000 UNIT/GM EX CREA
TOPICAL_CREAM | Freq: Two times a day (BID) | CUTANEOUS | Status: DC
  Filled 2019-10-10: qty 15

## 2019-10-10 MED ORDER — FLUCONAZOLE 150 MG PO TABS
150.0000 mg | ORAL_TABLET | Freq: Once | ORAL | Status: AC
  Administered 2019-10-10: 150 mg via ORAL
  Filled 2019-10-10: qty 1

## 2019-10-10 NOTE — Progress Notes (Addendum)
Patient c/o increased itching and rash left groin. He refused Vancomycin dose at this time stating that since the dosage was increased the rash and itching is worse. MD informed. Diflucan po and nystatin cream ordered. Patient educated but still said "no" to the due dose of vancomycin.

## 2019-10-10 NOTE — Progress Notes (Signed)
Plan of care reviewed. Pt's hemodynamically stable. Awaiting to be called for MRI. Complained back pain scale 8-10/10. Alternated with Dilaudid and Morphine and Flexeril. He's able to rest well tonight after med given.  No immediate distress noted tonight. We continue to monitor.   Filiberto Pinks, RN

## 2019-10-10 NOTE — Progress Notes (Signed)
Scott NOTE    CADENCE Green  IDP:[id number] DOB: [birth year] DOA: 09/18/2019 PCP: No primary care provider on file.   Brief Narrative:   Patient was admitted to the hospital with working diagnosis of sepsis secondary to tricuspid valve endocarditis, complicated with paraspinal muscle abscess, septic pulmonary emboli and infrarenal IVC thrombosis.  7/22: C/o of left groin rash. Looks like yeast. Add nystatin. Add fluconazole 167m x 1. Continue vancomycin. MRI completed. Awaiting read.  Assessment & Plan:   Principal Problem:   MRSA bacteremia Active Problems:   Abscess of paraspinal muscles   [place]   [place]   Septic embolism ([facility])   Cellulitis of flank   Chronic, continuous use of opioids   Endocarditis of tricuspid valve  Tricuspid valve endocarditis Paraspinal/epidural abscess Septic pulmonary eboli     - s/p MIS laminectomy on 07/4.     - Patient has mild edema at the site of the surgical incision, no local erythema or edema, no purulence. Possible seroma. Previous physician called Dr. OZada Findersduring office hours for non urgent consult.       - Continue pain control with acetaminophen, cyclobenzaprine, hydromorphone q6H PRN and morphine 462mbid.    - IV antibiotic therapy with IV vancomycin to complete on 07/28(per pharmacy protocol).     - Patient complains of pruritus with IV vancomycin but no rash, continue with benadryl.      - Follow ID recommendations for CBC. CRP, ESR twice per week and bmp plus vanc levels per pharmacy.      - Plan totransition to linezolid or oritavancin/dalbavancinwhen completed IV therapy in 10 more days     - 7/22: MRI completed and awaiting read, will speak with neurosurgery after results  Infrarenal IVC thrombosis.     - Septic thrombus, non occlusive.      - Patient initially anticoagulated with iV heparin, then transitioned to oral apixaban.     - Continue to ambulate and out of bed as  tolerated.  Mild hyponatremia.     - resolved  Hep C.     - Follow as outpatient.  Chronic pain syndrome     - patient denied being on opiates before admission  Intertrigo/yeast infection     - nystatin, fluconazole, follow  DVT prophylaxis: eliquis Code Status: FULL Family Communication: None at bedside.   Status is: Inpatient  Remains inpatient appropriate because:Inpatient level of care appropriate due to severity of illness   Dispo: The patient is from: Home              Anticipated d/c is to: Home              Anticipated d/c date is: > 3 days              Patient currently is not medically stable to d/c.  Consultants:   ID  Neurosurgery  Antimicrobials:  . vancomycin   ROS:  Reports groin rash. Denies CP, N, V, ab pain. Remainder ROS is negative for all not previously mentioned.  Subjective: "It itches."  Objective: Vitals:   10/09/19 1755 10/09/19 1949 10/09/19 2332 10/10/19 0437  BP: 109/71 98/65 113/68 96/69  Pulse: 96 (!) 102 (!) 111 (!) 106  Resp: 14 20 18 18   Temp: 98.2 F (36.8 C) 99.5 F (37.5 C) 99.2 F (37.3 C) 98.3 F (36.8 C)  TempSrc: Oral Oral Oral Oral  SpO2: 100% 100% 98% 100%  Weight:      Height:  Intake/Output Summary (Last 24 hours) at 10/10/2019 7341 Last data filed at 10/10/2019 0501 Gross per 24 hour  Intake 1750 ml  Output 650 ml  Net 1100 ml   Filed Weights   09/18/19 2300  Weight: 81.7 kg   Examination:  General: 34 y.o. male resting in bed in NAD Cardiovascular: RRR, +S1, S2, no m/g/r, equal pulses throughout Respiratory: CTABL, no w/r/r, normal WOB GI: BS+, NDNT, no masses noted, no organomegaly noted MSK: No e/c/c Neuro: A&O x 3, no focal deficits Psyc: Appropriate interaction and affect, calm/cooperative  Data Reviewed: I have personally reviewed following labs and imaging studies.  CBC: Recent Labs  Lab 10/08/19 0450 10/10/19 0452  WBC 4.2 4.4  NEUTROABS 2.1 2.1  HGB 10.1* 10.2*   HCT 30.3* 30.8*  MCV 86.6 86.5  PLT 224 937   Basic Metabolic Panel: Recent Labs  Lab 10/04/19 0542 10/04/19 0844 10/06/19 1115 10/09/19 1031 10/10/19 0452  NA 130* 131* 133* 133* 134*  K 4.4 4.0 3.8 4.2 3.9  CL 96* 96* 97* 98 99  CO2 26 25 28 25 27   GLUCOSE 105* 152* 113* 93 105*  BUN 19 18 13 12 12   CREATININE 0.95 1.00 0.89 0.92 0.91  CALCIUM 8.6* 8.5* 8.7* 8.8* 8.5*  MG  --   --   --   --  1.7  PHOS  --   --   --  3.1 4.2   GFR: Estimated Creatinine Clearance: 118.1 mL/min (by C-G formula based on SCr of 0.91 mg/dL). Liver Function Tests: Recent Labs  Lab 10/09/19 1031 10/10/19 0452  ALBUMIN 2.6* 2.5*   No results for input(s): LIPASE, AMYLASE in the last 168 hours. No results for input(s): AMMONIA in the last 168 hours. Coagulation Profile: No results for input(s): INR, PROTIME in the last 168 hours. Cardiac Enzymes: No results for input(s): CKTOTAL, CKMB, CKMBINDEX, TROPONINI in the last 168 hours. BNP (last 3 results) No results for input(s): PROBNP in the last 8760 hours. HbA1C: No results for input(s): HGBA1C in the last 72 hours. CBG: No results for input(s): GLUCAP in the last 168 hours. Lipid Profile: No results for input(s): CHOL, HDL, LDLCALC, TRIG, CHOLHDL, LDLDIRECT in the last 72 hours. Thyroid Function Tests: No results for input(s): TSH, T4TOTAL, FREET4, T3FREE, THYROIDAB in the last 72 hours. Anemia Panel: No results for input(s): VITAMINB12, FOLATE, FERRITIN, TIBC, IRON, RETICCTPCT in the last 72 hours. Sepsis Labs: No results for input(s): PROCALCITON, LATICACIDVEN in the last 168 hours.  No results found for this or any previous visit (from the past 240 hour(s)).    Radiology Studies: No results found.   Scheduled Meds: . apixaban  5 mg Oral BID  . Chlorhexidine Gluconate Cloth  6 each Topical Daily  . feeding supplement (ENSURE ENLIVE)  237 mL Oral BID BM  .  HYDROmorphone (DILAUDID) injection  1 mg Intravenous Once  .  morphine  45 mg Oral Q12H  . sodium chloride flush  10-40 mL Intracatheter Q12H   Continuous Infusions: . lactated ringers    . vancomycin 1,500 mg (10/10/19 0048)     LOS: 22 days    Time spent: 25 minutes spent in the coordination of care today.    Jonnie Finner, DO [facility]  If 7PM-7AM, please contact night-coverage [email or web address] 10/10/2019, 7:42 AM

## 2019-10-10 NOTE — Plan of Care (Signed)
  Problem: Fluid Volume: Goal: Hemodynamic stability will improve 10/10/2019 1108 by Kimberlie Csaszar, Godfrey Pick, RN Outcome: Completed/Met 10/10/2019 1108 by Addasyn Mcbreen, Godfrey Pick, RN Outcome: Progressing   Problem: Respiratory: Goal: Ability to maintain adequate ventilation will improve 10/10/2019 1108 by Lukka Black, Godfrey Pick, RN Outcome: Completed/Met 10/10/2019 1108 by Kanasia Gayman, Godfrey Pick, RN Outcome: Progressing   Problem: Education: Goal: Knowledge of General Education information will improve Description: Including pain rating scale, medication(s)/side effects and non-pharmacologic comfort measures Outcome: Progressing

## 2019-10-11 DIAGNOSIS — T368X5A Adverse effect of other systemic antibiotics, initial encounter: Secondary | ICD-10-CM

## 2019-10-11 DIAGNOSIS — L271 Localized skin eruption due to drugs and medicaments taken internally: Secondary | ICD-10-CM

## 2019-10-11 LAB — CBC WITH DIFFERENTIAL/PLATELET
Abs Immature Granulocytes: 0.05 10*3/uL (ref 0.00–0.07)
Basophils Absolute: 0 10*3/uL (ref 0.0–0.1)
Basophils Relative: 0 %
Eosinophils Absolute: 0.6 10*3/uL — ABNORMAL HIGH (ref 0.0–0.5)
Eosinophils Relative: 10 %
HCT: 33.4 % — ABNORMAL LOW (ref 39.0–52.0)
Hemoglobin: 10.9 g/dL — ABNORMAL LOW (ref 13.0–17.0)
Immature Granulocytes: 1 %
Lymphocytes Relative: 16 %
Lymphs Abs: 1 10*3/uL (ref 0.7–4.0)
MCH: 28.2 pg (ref 26.0–34.0)
MCHC: 32.6 g/dL (ref 30.0–36.0)
MCV: 86.3 fL (ref 80.0–100.0)
Monocytes Absolute: 0.5 10*3/uL (ref 0.1–1.0)
Monocytes Relative: 7 %
Neutro Abs: 4.1 10*3/uL (ref 1.7–7.7)
Neutrophils Relative %: 66 %
Platelets: 260 10*3/uL (ref 150–400)
RBC: 3.87 MIL/uL — ABNORMAL LOW (ref 4.22–5.81)
RDW: 13.2 % (ref 11.5–15.5)
WBC: 6.1 10*3/uL (ref 4.0–10.5)
nRBC: 0 % (ref 0.0–0.2)

## 2019-10-11 LAB — RENAL FUNCTION PANEL
Albumin: 2.4 g/dL — ABNORMAL LOW (ref 3.5–5.0)
Anion gap: 7 (ref 5–15)
BUN: 12 mg/dL (ref 6–20)
CO2: 26 mmol/L (ref 22–32)
Calcium: 8.6 mg/dL — ABNORMAL LOW (ref 8.9–10.3)
Chloride: 100 mmol/L (ref 98–111)
Creatinine, Ser: 0.98 mg/dL (ref 0.61–1.24)
GFR calc Af Amer: >60 mL/min (ref >60)
GFR calc non Af Amer: >60 mL/min (ref >60)
Glucose, Bld: 115 mg/dL — ABNORMAL HIGH (ref 70–99)
Phosphorus: 4.1 mg/dL (ref 2.5–4.6)
Potassium: 4.2 mmol/L (ref 3.5–5.1)
Sodium: 133 mmol/L — ABNORMAL LOW (ref 135–145)

## 2019-10-11 LAB — MAGNESIUM: Magnesium: 1.5 mg/dL — ABNORMAL LOW (ref 1.7–2.4)

## 2019-10-11 MED ORDER — LINEZOLID 600 MG PO TABS
600.0000 mg | ORAL_TABLET | Freq: Two times a day (BID) | ORAL | Status: DC
  Administered 2019-10-11 – 2019-10-12 (×2): 600 mg via ORAL
  Filled 2019-10-11 (×4): qty 1

## 2019-10-11 MED ORDER — MAGNESIUM SULFATE 2 GM/50ML IV SOLN
2.0000 g | Freq: Once | INTRAVENOUS | Status: AC
  Administered 2019-10-11: 2 g via INTRAVENOUS
  Filled 2019-10-11: qty 50

## 2019-10-11 MED ORDER — DIPHENHYDRAMINE HCL 25 MG PO CAPS
50.0000 mg | ORAL_CAPSULE | Freq: Four times a day (QID) | ORAL | Status: DC | PRN
  Administered 2019-10-11 – 2019-10-12 (×4): 50 mg via ORAL
  Filled 2019-10-11 (×4): qty 2

## 2019-10-11 MED ORDER — LINEZOLID 600 MG PO TABS: 600.0000 mg | ORAL_TABLET | Freq: Two times a day (BID) | ORAL | 0 refills | Status: AC

## 2019-10-11 MED FILL — LINEZOLID 600 MG TABS: 600 | 30 days supply | Qty: 60 | Fill #0

## 2019-10-11 NOTE — Progress Notes (Signed)
[facility] for Infectious Disease  Date of Admission:  09/18/2019      Total days of antibiotics 24  Vancomycin      ASSESSMENT: Scott Green is a 34 y.o. 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 Dr. Christella Noa has seen him today and no concerns following spine surgery. TCTS has seen him for TV endocarditis - too small for angiovac benefit and no structural findings concerning for valve replacement.   He does have a rash c/w delayed drug hypersensitivity - at the right time to see this too around 3 weeks in. Will check CBC with diff in AM. SCr today stable. Stop vancomycin and change to linezolid PO BID now. We will arrange 30-day supply and an appointment to see our team in 2 weeks outpatient. He should get the full course of linezolid to finish out 8 weeks of total anti-mrsa coverage for vertebral infection.   Chronic hepatitis C infection, genotype 1a - will help treat him outpatient after acute infection has resolved. HIV NR, Hep B sAg NR   OK to discharge on oral antibiotics.    PLAN: 1. Stop Vancomycin  2. Start linezolid 600 mg BID  3. Check CRP, ESR in AM  4. Likely D/C over the weekend  5. He has follow up with our team.     Principal Problem:   MRSA bacteremia Active Problems:   Abscess of paraspinal muscles   Sepsis ([place])   IVC thrombosis ([place])   Septic embolism (HCC)   Cellulitis of flank   Chronic, continuous use of opioids   Endocarditis of tricuspid valve   . apixaban  5 mg Oral BID  . Chlorhexidine Gluconate Cloth  6 each Topical Daily  . feeding supplement (ENSURE ENLIVE)  237 mL Oral BID BM  .  HYDROmorphone (DILAUDID) injection  1 mg Intravenous Once  . linezolid  600 mg Oral Q12H  . morphine  45 mg Oral Q12H  . nystatin cream   Topical BID  . sodium chloride flush  10-40 mL Intracatheter Q12H    SUBJECTIVE: Red rash has erupted over his torso, flanks, arms and upper  thighs.  Itches bad with vancomycin infusing but otherwise not too itchy.    Review of Systems: Review of Systems  Constitutional: Negative for chills, fever and malaise/fatigue.  Respiratory: Negative for cough, hemoptysis and shortness of breath.   Cardiovascular: Negative for chest pain.  Gastrointestinal: Negative for abdominal pain, nausea and vomiting.  Musculoskeletal: Positive for back pain (improved).  Skin: Positive for itching. Negative for rash.  Neurological: Negative for sensory change, focal weakness and weakness.    Allergies  Allergen Reactions  . Tramadol Other (See Comments)    Stomach pain    OBJECTIVE: Vitals:   10/11/19 0036 10/11/19 0509 10/11/19 0905 10/11/19 1330  BP: (!) 98/61 115/67 97/72 (!) 103/62  Pulse: 105 (!) 125 104   Resp: _0 Temp: 98.5 F (36.9 C) 99.9 F (37.7 C) 98.3 F (36.8 C) 98.3 F (36.8 C)  TempSrc: Oral Oral Oral Oral  SpO2: 100% 98% 100% 100%  Weight:      Height:       Body mass index is 28.21 kg/m.  Physical Exam Constitutional:      Appearance: He is not ill-appearing.  Eyes:     General: No scleral icterus.    Pupils: Pupils are equal, round, and reactive to light.  Cardiovascular:     Rate and Rhythm: Normal rate and regular rhythm.  Pulmonary:     Effort: Pulmonary effort is normal.     Breath sounds: Normal breath sounds.  Abdominal:     General: There is no distension.     Palpations: Abdomen is soft.     Tenderness: There is no abdominal tenderness.  Musculoskeletal:     Cervical back: Neck supple.     Lumbar back: Swelling (improved today. no pain surrounding incision with moderate-deep palpation ) present.  Skin:    General: Skin is warm and dry.     Capillary Refill: Capillary refill takes less than 2 seconds.     Findings: Rash (he has a coalesced macular erythematous rash overlying A/P torso with some spared areas that appear more macular to upper inner arms and upper thighs.) present. No  erythema.  Neurological:     Mental Status: He is alert and oriented to person, place, and time.     Lab Results Lab Results  Component Value Date   WBC 6.1 10/11/2019   HGB 10.9 (L) 10/11/2019   HCT 33.4 (L) 10/11/2019   MCV 86.3 10/11/2019   PLT 260 10/11/2019    Lab Results  Component Value Date   CREATININE 0.98 10/11/2019   BUN 12 10/11/2019   NA 133 (L) 10/11/2019   K 4.2 10/11/2019   CL 100 10/11/2019   CO2 26 10/11/2019    Lab Results  Component Value Date   ALT 25 09/22/2019   AST 23 09/22/2019   ALKPHOS 53 09/22/2019   BILITOT 0.4 09/22/2019     Microbiology: No results found for this or any previous visit (from the past 240 hour(s)).   Janene Madeira, MSN, NP-C [facility] for Infectious Disease [email or web address] Pager: [phone] Office: [phone] [place]: [phone]

## 2019-10-11 NOTE — Progress Notes (Signed)
Plan of care reviewed. Pt's been progressing. He's hemodynamically stable. Remained afebrile.  Temp 98.2- 98.5 F. BP 98/61 -99/64 mmHg,  HR 80s-105, RR 18-20, SPO2 100% on room air.  Pt agreed for Vancomycin administration schedule tonigh at 01:00 am. He complained about rash and itching only around left groin area. Nystatin cream and Benadryl given. His itching relieved. He continuously concerned about drug allergies,especially Vancomycin. Education given about medication side effects. He would like to talk with MD at am about medication of choices he can have. His pain controlled well. No acute distress noted. We will continue to monitor.  Tona Sensing, RN

## 2019-10-11 NOTE — Progress Notes (Signed)
Patient ID: Scott Green, male   DOB: 12-04-1985, 34 y.o.   MRN: [id number] BP 97/72 (BP Location: Left Arm)   Pulse 104   Temp 98.3 F (36.8 C) (Oral)   Resp 18   Ht 5\' 7"  (1.702 m)   Wt 81.7 kg   SpO2 100%   BMI 28.21 kg/m  Alert and oriented x 4 Speech is clear and fluent Normal strength in the lower extremities Normal sensation in the lower extremities Normal proprioception The mri scan is remarkably improved from the preop scan. There are no neurosurgical issues.  We typically wait approximately 6-8 weeks for a post op scan because scans done this early will always show immediate post op fluid collections which radiology finds hard to distinguish or fully characterize.  There are no worrisome signs with regards to this scan.  Given other forms of vancomycin treatment non iv treatment should be strongly considered. He would seem to be an ideal candidate for said long form vancomycin treatment.

## 2019-10-11 NOTE — Progress Notes (Signed)
PROGRESS NOTE    Scott Green  QMV:[id number] DOB: 06-12-1985 DOA: 09/18/2019 PCP: No primary care provider on file.   Brief Narrative:   Patient was admitted to the hospital with working diagnosis of sepsis secondary to tricuspid valve endocarditis, complicated with paraspinal muscle abscess, septic pulmonary emboli and infrarenal IVC thrombosis.  7/23: Spoke with neurosurgery about MRI results. Nothing worrisome per their reports. Pt complains about whole body rash. ID to adjust abx.   Assessment & Plan:   Principal Problem:   MRSA bacteremia Active Problems:   Abscess of paraspinal muscles   Sepsis ([place])   IVC thrombosis ([place])   Septic embolism (HCC)   Cellulitis of flank   Chronic, continuous use of opioids   Endocarditis of tricuspid valve  Tricuspid valve endocarditis Paraspinal/epidural abscess Septic pulmonary eboli - s/p MIS laminectomy on 07/4. - Patient has mild edema at the site of the surgical incision, no local erythema or edema, no purulence. Possible seroma. Previous physician called Dr. Zada Finders during office hours for non urgent consult.  - Continue pain control with acetaminophen, cyclobenzaprine, hydromorphone q6H PRN and morphine 81m bid.  - IV antibiotic therapy with IV vancomycin to complete on 07/28(per pharmacy protocol). - Patient complains of pruritus with IV vancomycin but no rash, continue with benadryl.  - Follow ID recommendations for CBC. CRP, ESR twice per week and bmp plus vanc levels per pharmacy.  - Plan totransition to linezolid or oritavancin/dalbavancinwhen completed IV therapy in 10 more days - 7/23:  MRI results noted; spoke with neurosurgery, they have reviewed, no new intervention planned; ID to adjust abx d/t rash  Infrarenal IVC thrombosis. - Septic thrombus, non occlusive.  - Patient initially anticoagulated with iV heparin, then transitioned to oral apixaban. - Continue to  ambulate and out of bed as tolerated.  Mild hyponatremia. Hypomagnesemia - Na+ stable, add Mg2+  Hep C. - Follow as outpatient.  Chronic pain syndrome - patient denied being on opiates before admission  Intertrigo/yeast infection     - nystatin, fluconazole, follow  DVT prophylaxis: eliquis Code Status: FULL Family Communication: None at bedside.   Status is: Inpatient  Remains inpatient appropriate because:Inpatient level of care appropriate due to severity of illness   Dispo: The patient is from: Home              Anticipated d/c is to: Home              Anticipated d/c date is: > 3 days              Patient currently is not medically stable to d/c.  Consultants:   ID  Neurosurgery  Antimicrobials:  . vancomycin   ROS:  Reports rash. Denies CP, N, V, back pain. Remainder ROS is negative for all not previously mentioned.  Subjective: "I'm still itching."  Objective: Vitals:   10/10/19 1737 10/10/19 2035 10/11/19 0036 10/11/19 0509  BP: 102/67 (!) 99/64 (!) 98/61 115/67  Pulse:  84 105 (!) 125  Resp:  18 18 20   Temp:  98.2 F (36.8 C) 98.5 F (36.9 C) 99.9 F (37.7 C)  TempSrc:  Oral Oral Oral  SpO2:  100% 100% 98%  Weight:      Height:        Intake/Output Summary (Last 24 hours) at 10/11/2019 0748 Last data filed at 10/11/2019 0500 Gross per 24 hour  Intake 2310.73 ml  Output 600 ml  Net 1710.73 ml   Filed Weights   09/18/19 2300  Weight: 81.7 kg    Examination:  General: 34 y.o. male resting in bed in NAD Cardiovascular: RRR, +S1, S2, no m/g/r, equal pulses throughout Respiratory: CTABL, no w/r/r, normal WOB GI: BS+, NDNT, no masses noted, no organomegaly noted MSK: No e/c/c Neuro: A&O x 3, no focal deficits Psyc: Appropriate interaction and affect, calm/cooperative   Data Reviewed: I have personally reviewed following labs and imaging studies.  CBC: Recent Labs  Lab 10/08/19 0450 10/10/19 0452 10/11/19 0500    WBC 4.2 4.4 6.1  NEUTROABS 2.1 2.1 4.1  HGB 10.1* 10.2* 10.9*  HCT 30.3* 30.8* 33.4*  MCV 86.6 86.5 86.3  PLT 224 238 212   Basic Metabolic Panel: Recent Labs  Lab 10/04/19 0844 10/06/19 1115 10/09/19 1031 10/10/19 0452 10/11/19 0500  NA 131* 133* 133* 134* 133*  K 4.0 3.8 4.2 3.9 4.2  CL 96* 97* 98 99 100  CO2 25 28 25 27 26   GLUCOSE 152* 113* 93 105* 115*  BUN 18 13 12 12 12   CREATININE 1.00 0.89 0.92 0.91 0.98  CALCIUM 8.5* 8.7* 8.8* 8.5* 8.6*  MG  --   --   --  1.7 1.5*  PHOS  --   --  3.1 4.2 4.1   GFR: Estimated Creatinine Clearance: 109.6 mL/min (by C-G formula based on SCr of 0.98 mg/dL). Liver Function Tests: Recent Labs  Lab 10/09/19 1031 10/10/19 0452 10/11/19 0500  ALBUMIN 2.6* 2.5* 2.4*   No results for input(s): LIPASE, AMYLASE in the last 168 hours. No results for input(s): AMMONIA in the last 168 hours. Coagulation Profile: No results for input(s): INR, PROTIME in the last 168 hours. Cardiac Enzymes: No results for input(s): CKTOTAL, CKMB, CKMBINDEX, TROPONINI in the last 168 hours. BNP (last 3 results) No results for input(s): PROBNP in the last 8760 hours. HbA1C: No results for input(s): HGBA1C in the last 72 hours. CBG: No results for input(s): GLUCAP in the last 168 hours. Lipid Profile: No results for input(s): CHOL, HDL, LDLCALC, TRIG, CHOLHDL, LDLDIRECT in the last 72 hours. Thyroid Function Tests: No results for input(s): TSH, T4TOTAL, FREET4, T3FREE, THYROIDAB in the last 72 hours. Anemia Panel: No results for input(s): VITAMINB12, FOLATE, FERRITIN, TIBC, IRON, RETICCTPCT in the last 72 hours. Sepsis Labs: No results for input(s): PROCALCITON, LATICACIDVEN in the last 168 hours.  No results found for this or any previous visit (from the past 240 hour(s)).    Radiology Studies: MR Lumbar Spine W Wo Contrast  Result Date: 10/10/2019 CLINICAL DATA:  Epidural abscess follow-up status post surgical drainage. History of tricuspid  valve endocarditis. EXAM: MRI LUMBAR SPINE WITHOUT AND WITH CONTRAST TECHNIQUE: Multiplanar and multiecho pulse sequences of the lumbar spine were obtained without and with intravenous contrast. CONTRAST:  93m GADAVIST GADOBUTROL 1 MMOL/ML IV SOLN COMPARISON:  CT abdomen pelvis dated September 24, 2019. MRI lumbar spine dated September 20, 2019. FINDINGS: Segmentation:  Standard. Alignment:  Physiologic. Vertebrae: Mildly progressed marrow edema with enhancement involving the L3 greater than L2 vertebral bodies and associated right greater than left posterior elements. No fracture, evidence of discitis, or suspicious bone lesion. Conus medullaris and cauda equina: Conus extends to the L1 level. Conus and cauda equina appear normal. Residual dorsal epidural enhancement at L1-L2. No intradural enhancement. Paraspinal and other soft tissues: Right greater than left paraspinous muscle edema with interval decrease in size of multiple rim enhancing fluid collections in the right paraspinous musculature. For example, the largest collection posterior to and likely communicating with the  right L2-L3 facet joint currently measures 2.9 cm in craniocaudal dimension, previously 4.5 cm. Disc levels: T12-L1:  Negative. L1-L2: Interval left hemilaminectomy. Thick-walled 1.7 x 1.1 x 1.4 cm rim enhancing complex fluid collection in the laminectomy bed (series 15, image 11). Negative disc. Resolved spinal canal stenosis stenosis. No neuroforaminal stenosis. L2-L3: Negative disc. Right facet joint effusion with synovial enhancement. Phlegmonous change and enhancement along the right ligamentum flavum (series 15, image 19). No stenosis. L3-L4: Negative disc. Right facet joint effusion with synovial enhancement and phlegmonous change along the right ligamentum flavum. No stenosis. L4-L5: Unchanged small central disc protrusion with annular fissure. Unchanged mild bilateral facet arthropathy. No stenosis. L5-S1:  Negative. IMPRESSION: 1. Interval  left hemilaminectomy at L1-L2 with resolution of the epidural abscess and associated spinal canal stenosis. Residual dorsal epidural enhancement with thick-walled 1.7 cm rim enhancing complex fluid collection in the laminectomy bed, concerning for abscess. 2. Mildly progressed marrow edema with enhancement involving the L3 greater than L2 vertebral bodies and associated right greater than left posterior elements, again concerning for osteomyelitis. 3. Right L2-L3 and L3-L4 facet joint septic arthritis with mild right-sided sub ligamentum flavum phlegmonous change and enhancement at both levels. Decreased size of multiple associated abscesses in the right paraspinous musculature. Electronically Signed   By: Titus Dubin M.D.   On: 10/10/2019 16:16     Scheduled Meds: . apixaban  5 mg Oral BID  . Chlorhexidine Gluconate Cloth  6 each Topical Daily  . feeding supplement (ENSURE ENLIVE)  237 mL Oral BID BM  .  HYDROmorphone (DILAUDID) injection  1 mg Intravenous Once  . morphine  45 mg Oral Q12H  . nystatin cream   Topical BID  . sodium chloride flush  10-40 mL Intracatheter Q12H   Continuous Infusions: . lactated ringers    . vancomycin 1,500 mg (10/11/19 0032)     LOS: 23 days    Time spent: 25 minutes spent in the coordination of care today.    Jonnie Finner, DO [facility]  If 7PM-7AM, please contact night-coverage [email or web address] 10/11/2019, 7:48 AM

## 2019-10-12 DIAGNOSIS — I76 Septic arterial embolism: Secondary | ICD-10-CM

## 2019-10-12 LAB — CBC WITH DIFFERENTIAL/PLATELET
Abs Immature Granulocytes: 0.05 10*3/uL (ref 0.00–0.07)
Basophils Absolute: 0 10*3/uL (ref 0.0–0.1)
Basophils Relative: 0 %
Eosinophils Absolute: 0.9 10*3/uL — ABNORMAL HIGH (ref 0.0–0.5)
Eosinophils Relative: 17 %
HCT: 35 % — ABNORMAL LOW (ref 39.0–52.0)
Hemoglobin: 11.3 g/dL — ABNORMAL LOW (ref 13.0–17.0)
Immature Granulocytes: 1 %
Lymphocytes Relative: 26 %
Lymphs Abs: 1.3 10*3/uL (ref 0.7–4.0)
MCH: 28.1 pg (ref 26.0–34.0)
MCHC: 32.3 g/dL (ref 30.0–36.0)
MCV: 87.1 fL (ref 80.0–100.0)
Monocytes Absolute: 0.5 10*3/uL (ref 0.1–1.0)
Monocytes Relative: 9 %
Neutro Abs: 2.3 10*3/uL (ref 1.7–7.7)
Neutrophils Relative %: 47 %
Platelets: 237 10*3/uL (ref 150–400)
RBC: 4.02 MIL/uL — ABNORMAL LOW (ref 4.22–5.81)
RDW: 13.5 % (ref 11.5–15.5)
WBC: 5 10*3/uL (ref 4.0–10.5)
nRBC: 0 % (ref 0.0–0.2)

## 2019-10-12 LAB — C-REACTIVE PROTEIN: CRP: <0.5 mg/dL (ref <1.0)

## 2019-10-12 LAB — SEDIMENTATION RATE: Sed Rate: 35 mm/hr — ABNORMAL HIGH (ref 0–16)

## 2019-10-12 MED ORDER — APIXABAN 5 MG PO TABS: 5.0000 mg | ORAL_TABLET | Freq: Two times a day (BID) | ORAL | 0 refills | Status: AC

## 2019-10-12 MED ORDER — CYCLOBENZAPRINE HCL 5 MG PO TABS: 5.0000 mg | ORAL_TABLET | Freq: Three times a day (TID) | ORAL | 0 refills | Status: AC | PRN

## 2019-10-12 MED ORDER — MORPHINE SULFATE ER 15 MG PO TBCR: 45.0000 mg | EXTENDED_RELEASE_TABLET | Freq: Two times a day (BID) | ORAL | 0 refills | Status: AC

## 2019-10-12 NOTE — Discharge Summary (Signed)
Physician Discharge Summary  Scott Green:[id number] DOB: 1985-06-28 DOA: 09/18/2019  PCP: No primary care provider on file.  Admit date: 09/18/2019 Discharge date: 10/12/2019  Admitted From: Home Disposition:  Discharged to home.  Recommendations for Outpatient Follow-up:  1. Establish Primary Care. 2. Follow up with Infectious Disease as scheduled ([phone])  Discharge Condition: Stable  CODE STATUS: FULL   Brief/Interim Summary: Brief Narrative:   Patient was admitted to the hospital with working diagnosis of sepsis secondary to tricuspid valve endocarditis, complicated with paraspinal muscle abscess, septic pulmonary emboli and infrarenal IVC thrombosis.  7/24: Rash improved. Now on zyvox. Continue for next 30 days. Follow up with ID. Follow up with PCP. [place] for discharge to home.   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 - Patient has mild edema at the site of the surgical incision, no local erythema or edema, no purulence. Possible seroma. Previous physician called Dr. Zada Finders during office hours for non urgent consult.  - Continue pain control with acetaminophen, cyclobenzaprine, hydromorphone q6H PRN and morphine 39m bid.  - IV antibiotic therapy with IV vancomycin to complete on 07/28(per pharmacy protocol). - Patient complains of pruritus with IV vancomycin but no rash, continue with benadryl.  - Follow ID recommendations for CBC. CRP, ESR twice per week and bmp plus vanc levels per pharmacy.  -MRIresults noted; spoke with neurosurgery, they have reviewed, no new intervention planned; ID  to adjust abx d/t rash     - 7/24: Medication changed to linezolid. Has a 30-day supply. ID will follow up outpt. D/C PICC. Ok to discharge to home.    Infrarenal IVC thrombosis. - Septic thrombus, non occlusive.  - Patient initially anticoagulated with iV heparin, then transitioned to oral apixaban. - Continue to ambulate and out of bed as tolerated.  Mild hyponatremia. Hypomagnesemia - Na+ stable, add Mg2+     - 7/24: follow up outpt  Hep C. - Follow as outpatient.  Chronic pain syndrome - patient denied being on opiates before admission  Intertrigo/yeast infection - nystatin, fluconazole, follow  Discharge Instructions   Allergies as of 10/12/2019      Reactions   Tramadol Other (See Comments)   Stomach pain      Medication List    TAKE these medications   apixaban 5 MG Tabs tablet Commonly known as: ELIQUIS Take 1 tablet (5 mg total) by mouth 2 (two) times daily.   cyclobenzaprine 5 MG tablet Commonly known as: FLEXERIL Take 1 tablet (5 mg total) by mouth 3 (three) times daily as needed for up to 7 days for muscle spasms.   linezolid 600 MG tablet Commonly known as: ZYVOX Take 1 tablet (600 mg total) by mouth 2 (two) times daily.   morphine 15 MG 12 hr tablet Commonly known as: MS CONTIN Take 3 tablets (45 mg total) by mouth every 12 (twelve) hours for 3 days.       Allergies  Allergen Reactions  . Tramadol Other (See Comments)    Stomach pain    Consultations:  ID  Neurosurgery  Procedures/Studies: CT ABDOMEN PELVIS WO CONTRAST  Result Date: 09/24/2019 CLINICAL DATA:  Acute right flank pain. EXAM:  CT ABDOMEN AND PELVIS WITHOUT CONTRAST TECHNIQUE: Multidetector CT imaging of the abdomen and pelvis was performed following the standard protocol without IV contrast. COMPARISON:  September 22, 2019. FINDINGS: Lower chest: Moderate bilateral pleural effusions are noted with adjacent subsegmental atelectasis. Stable  findings consistent with bilateral septic pulmonary emboli within visualized lung bases as noted on prior exam. Hepatobiliary: No focal liver abnormality is seen. No gallstones, gallbladder wall thickening, or biliary dilatation. Pancreas: Unremarkable. No pancreatic ductal dilatation or surrounding inflammatory changes. Spleen: Normal in size without focal abnormality. Adrenals/Urinary Tract: Adrenal glands are unremarkable. Kidneys are normal, without renal calculi, focal lesion, or hydronephrosis. Bladder is unremarkable. Stomach/Bowel: Stomach is within normal limits. Appendix appears normal. No evidence of bowel wall thickening, distention, or inflammatory changes. Vascular/Lymphatic: No significant vascular findings are present. No enlarged abdominal or pelvic lymph nodes. Reproductive: Prostate is unremarkable. Other: Minimal ascites is noted in the pelvis. No definite hernia is noted. Minimal anasarca is noted. Musculoskeletal: No acute or significant osseous findings. IMPRESSION: 1. Moderate bilateral pleural effusions are noted with adjacent subsegmental atelectasis. 2. Stable findings consistent with bilateral septic pulmonary emboli within visualized lung bases as noted on prior exam. 3. Minimal ascites is noted in the pelvis. Minimal anasarca is noted. 4. No other abnormality seen in the abdomen or pelvis. Electronically Signed   By: Marijo Conception M.D.   On: 09/24/2019 12:33   DG Lumbar Spine 2-3 Views  Result Date: 09/21/2019 CLINICAL DATA:  Lumbar laminectomy for epidural abscess L1-2. EXAM: DG C-ARM 1-60 MIN; LUMBAR SPINE - 2-3 VIEW CONTRAST:  None. FLUOROSCOPY TIME:  Fluoroscopy Time:  0 minutes 22 seconds. Radiation Exposure Index (if provided by the fluoroscopic device): Unknown. Number of Acquired Spot Images: 2 COMPARISON:  MRI lumbar spine 09/20/2019 FINDINGS: AP and lateral spot images over the thoracolumbar spine demonstrate a surgical instrument from posterior approach with tip in the  midline of the lumbar spine. Difficult to accurately determine what level the surgical instrument is at based on these images although appears to be approximately L3 on the frontal image and approximately L1-2 region on the lateral image. Recommend correlation with findings at the time of the procedure. IMPRESSION: Intraoperative images over the thoracolumbar spine as described. Electronically Signed   By: Marin Olp M.D.   On: 09/21/2019 11:35   CT CHEST WO CONTRAST  Result Date: 09/19/2019 CLINICAL DATA:  Sepsis. EXAM: CT CHEST WITHOUT CONTRAST TECHNIQUE: Multidetector CT imaging of the chest was performed following the standard protocol without IV contrast. COMPARISON:  None. FINDINGS: Cardiovascular: No significant vascular findings. Normal heart size. No pericardial effusion. Mediastinum/Nodes: No enlarged mediastinal or axillary lymph nodes. Thyroid gland, trachea, and esophagus demonstrate no significant findings. Lungs/Pleura: No pneumothorax is noted. Small bilateral pleural effusions are noted. Multiple rounded but ill-defined airspace opacities are noted throughout both lungs concerning for septic emboli. Large right lower lobe airspace opacity is noted most consistent with pneumonia. Left posterior basilar subsegmental atelectasis is noted. Upper Abdomen: No acute abnormality. Musculoskeletal: No chest wall mass or suspicious bone lesions identified. IMPRESSION: Multiple rounded but ill-defined airspace opacities are noted throughout both lungs concerning for septic emboli. Large right lower lobe airspace opacity is noted most consistent with pneumonia. Small bilateral pleural effusions are noted. Electronically Signed   By: Marijo Conception M.D.   On: 09/19/2019 12:10   MR THORACIC SPINE WO CONTRAST  Result Date: 09/20/2019 CLINICAL DATA:  Mid back pain periods EXAM: MRI THORACIC SPINE WITHOUT CONTRAST TECHNIQUE: Multiplanar, multisequence  MR imaging of the thoracic spine was performed. No  intravenous contrast was administered. COMPARISON:  Chest CT September 19, 2019 FINDINGS: Alignment:  Physiologic. Vertebrae: Diffuse decrease of the T1 signal of the bone marrow of the visualized spine, may represent red marrow reconversion. No fracture, evidence of discitis, or focal bone lesion. Cord: Normal signal and morphology. Prominent central canal noted throughout the thoracic cord Paraspinal and other soft tissues: Bilateral pleural effusion and multiple rounded pulmonary lesions, best described on prior chest CT. Disc levels: T1-2: No spinal canal or neural foraminal stenosis. T2-3: No spinal canal or neural foraminal stenosis. T3-4: No spinal canal or neural foraminal stenosis. C4-5: No spinal canal or neural foraminal stenosis. C5-6: No spinal canal or neural foraminal stenosis. C6-7: Tiny central disc protrusion causing small indentation on the thecal sacand contacting the ventral aspect of the cord without cord compression or significant spinal canal or neural foraminal stenosis. T7-8: Small right central disc protrusion causing indentation of the thecal sac and contacting the ventral aspect of the cord without cord compression or significant spinal canal or neural foraminal stenosis. T8-9: No spinal canal or neural foraminal stenosis. T9-10: No spinal canal or neural foraminal stenosis. T10-11: No spinal canal or neural foraminal stenosis. T11-12: No spinal canal or neural foraminal stenosis IMPRESSION: 1. Tiny central disc protrusions at C6-7 and T7-8 causing mild indentation of the thecal sac and contacting the ventral aspect of the cord without cord compression or significant spinal canal stenosis. 2. Bilateral pleural effusion and multiple rounded pulmonary lesions, best described on prior chest CT. Electronically Signed   By: Pedro Earls M.D.   On: 09/20/2019 12:24   MR LUMBAR SPINE WO CONTRAST  Result Date: 09/20/2019 CLINICAL DATA:  Epidural abscess. EXAM: MRI LUMBAR SPINE  WITHOUT CONTRAST TECHNIQUE: Multiplanar, multisequence MR imaging of the lumbar spine was performed. No intravenous contrast was administered. COMPARISON:  CT of the abdomen September 17, 2019 FINDINGS: Limited study with only sagittal images obtained. Study was stopped at patient's request . Segmentation: Standard. Alignment:  Physiologic. Vertebrae: Increased T2 signal within the L2 and L3 vertebrae, may represent osteomyelitis. No endplate erosion or significant disc changes seen. Conus medullaris and cauda equina: Conus extends to the T12-L1 level. Conus and cauda equina appear normal. Paraspinal and other soft tissues: A posterior epidural phlegmon is seen from the level of the L1 superior endplate to the midbody of L3 (craniocaudal length a 8.7 cm) with at least 2 focal areas of more increased T2 signal, suggestive of abscesses, measuring approximately 1.7 cm and 1.2 cm at the L1-2 and L2-3 levels. Edema of the right so was and paraspinal musculature is noted, suggestive of myositis with multiple abscesses within the right paraspinal musculature, as seen on prior CT. Disc levels: L1-2: Above described epidural phlegmon/abscess causes moderate to severe compression of the thecal sac at this level. No neural foraminal narrowing. L2-3: The above described phlegmon/abscess causes mild narrowing of the thecal sac. No neural foraminal narrowing. L3-4: No spinal canal or neural foraminal stenosis. L4-5: Small central disc protrusion with annular tear. No significant spinal canal or neural foraminal stenosis. L5-S1: No spinal canal or neural foraminal stenosis. IMPRESSION: 1. Limited study with only sagittal images obtained. 2. Posterior epidural phlegmon/abscess from the level of the L1 superior endplate to the midbody of L3 (craniocaudal length a 8.7 cm) with at least 2 focal areas of more increased T2 signal, suggestive of abscesses, measuring approximately 1.7 cm and 1.2 cm at the L1-2  and L2-3 levels. 3. Edema of the  right paraspinal musculature, suggestive of myositis with multiple abscesses within the right paraspinal musculature, as seen on prior CT. 4. Increased T2 signal within the L2 and L3 vertebrae, may represent osteomyelitis. No endplate erosion or significant disc changes seen. 5. Mild narrowing of the thecal sac at L2-L3 and moderate to severe compression of the thecal sac at L1-L2 due to the epidural phlegmon/abscess. 6. These results will be called to the ordering clinician or representative by the Radiologist Assistant, and communication documented in the PACS or [employer]. Electronically Signed   By: Pedro Earls M.D.   On: 09/20/2019 13:25   MR Lumbar Spine W Wo Contrast  Result Date: 10/10/2019 CLINICAL DATA:  Epidural abscess follow-up status post surgical drainage. History of tricuspid valve endocarditis. EXAM: MRI LUMBAR SPINE WITHOUT AND WITH CONTRAST TECHNIQUE: Multiplanar and multiecho pulse sequences of the lumbar spine were obtained without and with intravenous contrast. CONTRAST:  2m GADAVIST GADOBUTROL 1 MMOL/ML IV SOLN COMPARISON:  CT abdomen pelvis dated September 24, 2019. MRI lumbar spine dated September 20, 2019. FINDINGS: Segmentation:  Standard. Alignment:  Physiologic. Vertebrae: Mildly progressed marrow edema with enhancement involving the L3 greater than L2 vertebral bodies and associated right greater than left posterior elements. No fracture, evidence of discitis, or suspicious bone lesion. Conus medullaris and cauda equina: Conus extends to the L1 level. Conus and cauda equina appear normal. Residual dorsal epidural enhancement at L1-L2. No intradural enhancement. Paraspinal and other soft tissues: Right greater than left paraspinous muscle edema with interval decrease in size of multiple rim enhancing fluid collections in the right paraspinous musculature. For example, the largest collection posterior to and likely communicating with the right L2-L3 facet joint  currently measures 2.9 cm in craniocaudal dimension, previously 4.5 cm. Disc levels: T12-L1:  Negative. L1-L2: Interval left hemilaminectomy. Thick-walled 1.7 x 1.1 x 1.4 cm rim enhancing complex fluid collection in the laminectomy bed (series 15, image 11). Negative disc. Resolved spinal canal stenosis stenosis. No neuroforaminal stenosis. L2-L3: Negative disc. Right facet joint effusion with synovial enhancement. Phlegmonous change and enhancement along the right ligamentum flavum (series 15, image 19). No stenosis. L3-L4: Negative disc. Right facet joint effusion with synovial enhancement and phlegmonous change along the right ligamentum flavum. No stenosis. L4-L5: Unchanged small central disc protrusion with annular fissure. Unchanged mild bilateral facet arthropathy. No stenosis. L5-S1:  Negative. IMPRESSION: 1. Interval left hemilaminectomy at L1-L2 with resolution of the epidural abscess and associated spinal canal stenosis. Residual dorsal epidural enhancement with thick-walled 1.7 cm rim enhancing complex fluid collection in the laminectomy bed, concerning for abscess. 2. Mildly progressed marrow edema with enhancement involving the L3 greater than L2 vertebral bodies and associated right greater than left posterior elements, again concerning for osteomyelitis. 3. Right L2-L3 and L3-L4 facet joint septic arthritis with mild right-sided sub ligamentum flavum phlegmonous change and enhancement at both levels. Decreased size of multiple associated abscesses in the right paraspinous musculature. Electronically Signed   By: WTitus DubinM.D.   On: 10/10/2019 16:16   DG C-Arm 1-60 Min  Result Date: 09/21/2019 CLINICAL DATA:  Lumbar laminectomy for epidural abscess L1-2. EXAM: DG C-ARM 1-60 MIN; LUMBAR SPINE - 2-3 VIEW CONTRAST:  None. FLUOROSCOPY TIME:  Fluoroscopy Time:  0 minutes 22 seconds. Radiation Exposure Index (if provided by the fluoroscopic device): Unknown. Number of Acquired Spot Images: 2  COMPARISON:  MRI lumbar spine 09/20/2019 FINDINGS: AP and lateral spot images over the thoracolumbar  spine demonstrate a surgical instrument from posterior approach with tip in the midline of the lumbar spine. Difficult to accurately determine what level the surgical instrument is at based on these images although appears to be approximately L3 on the frontal image and approximately L1-2 region on the lateral image. Recommend correlation with findings at the time of the procedure. IMPRESSION: Intraoperative images over the thoracolumbar spine as described. Electronically Signed   By: Marin Olp M.D.   On: 09/21/2019 11:35   ECHOCARDIOGRAM COMPLETE  Result Date: 09/19/2019    ECHOCARDIOGRAM REPORT   Patient Name:   ELLWOOD STEIDLE Date of Exam: 09/19/2019 Medical Rec #:  [id number]         Height:       67.0 in Accession #:    [id number]        Weight:       180.1 lb Date of Birth:  September 11, 1985         BSA:          1.934 m Patient Age:    34 years          BP:           127/76 mmHg Patient Gender: M                 HR:           99 bpm. Exam Location:  Inpatient Procedure: 2D Echo Indications:    Bacteremia R78.81  History:        Patient has no prior history of Echocardiogram examinations.  Sonographer:    Mikki Santee RDCS (AE) Referring Phys: [id number] BELKYS A REGALADO IMPRESSIONS  1. Left ventricular ejection fraction, by estimation, is 55 to 60%. The left ventricle has normal function. The left ventricle has no regional wall motion abnormalities. Left ventricular diastolic parameters were normal.  2. Right ventricular systolic function is normal. The right ventricular size is normal. Tricuspid regurgitation signal is inadequate for assessing PA pressure.  3. The mitral valve is normal in structure. No evidence of mitral valve regurgitation. No evidence of mitral stenosis.  4. The aortic valve is tricuspid. Aortic valve regurgitation is not visualized. No aortic stenosis is present.  5. The inferior vena  cava is normal in size with greater than 50% respiratory variability, suggesting right atrial pressure of 3 mmHg.  6. Normal coronary artery origins. Conclusion(s)/Recommendation(s): Normal biventricular function without evidence of hemodynamically significant valvular heart disease. No evidence of valvular vegetations on this transthoracic echocardiogram. FINDINGS  Left Ventricle: Left ventricular ejection fraction, by estimation, is 55 to 60%. The left ventricle has normal function. The left ventricle has no regional wall motion abnormalities. The left ventricular internal cavity size was normal in size. There is  no left ventricular hypertrophy. Left ventricular diastolic parameters were normal. Right Ventricle: The right ventricular size is normal. No increase in right ventricular wall thickness. Right ventricular systolic function is normal. Tricuspid regurgitation signal is inadequate for assessing PA pressure. Left Atrium: Left atrial size was normal in size. Right Atrium: Right atrial size was normal in size. Pericardium: There is no evidence of pericardial effusion. Mitral Valve: The mitral valve is normal in structure. Normal mobility of the mitral valve leaflets. No evidence of mitral valve regurgitation. No evidence of mitral valve stenosis. Tricuspid Valve: The tricuspid valve is normal in structure. Tricuspid valve regurgitation is trivial. No evidence of tricuspid stenosis. Aortic Valve: The aortic valve is tricuspid. Aortic valve regurgitation is not visualized. No aortic  stenosis is present. Pulmonic Valve: The pulmonic valve was normal in structure. Pulmonic valve regurgitation is trivial. No evidence of pulmonic stenosis. Aorta: The aortic root is normal in size and structure. Venous: The inferior vena cava is normal in size with greater than 50% respiratory variability, suggesting right atrial pressure of 3 mmHg. IAS/Shunts: No atrial level shunt detected by color flow Doppler.  LEFT VENTRICLE  PLAX 2D LVIDd:         4.90 cm  Diastology LVIDs:         3.40 cm  LV e' lateral:   9.03 cm/s LV PW:         1.00 cm  LV E/e' lateral: 9.8 LV IVS:        0.90 cm  LV e' medial:    10.10 cm/s LVOT diam:     2.00 cm  LV E/e' medial:  8.8 LV SV:         63 LV SV Index:   32 LVOT Area:     3.14 cm  RIGHT VENTRICLE RV S prime:     11.50 cm/s TAPSE (M-mode): 2.0 cm LEFT ATRIUM             Index       RIGHT ATRIUM           Index LA diam:        3.00 cm 1.55 cm/m  RA Area:     17.80 cm LA Vol (A2C):   39.5 ml 20.42 ml/m RA Volume:   41.80 ml  21.61 ml/m LA Vol (A4C):   20.6 ml 10.65 ml/m LA Biplane Vol: 29.6 ml 15.30 ml/m  AORTIC VALVE LVOT Vmax:   120.00 cm/s LVOT Vmean:  84.500 cm/s LVOT VTI:    0.200 m  AORTA Ao Root diam: 2.90 cm MITRAL VALVE MV Area (PHT): 2.87 cm    SHUNTS MV Decel Time: 264 msec    Systemic VTI:  0.20 m MV E velocity: 88.45 cm/s  Systemic Diam: 2.00 cm MV A velocity: 63.70 cm/s MV E/A ratio:  1.39 Cherlynn Kaiser MD Electronically signed by Cherlynn Kaiser MD Signature Date/Time: 09/19/2019/5:52:55 PM    Final    ECHO TEE  Result Date: 09/24/2019    TRANSESOPHOGEAL ECHO REPORT   Patient Name:   KEIRON IODICE Date of Exam: 09/24/2019 Medical Rec #:  [id number]         Height:       67.0 in Accession #:    [id number]        Weight:       180.1 lb Date of Birth:  January 13, 1986         BSA:          1.934 m Patient Age:    34 years          BP:           164/80 mmHg Patient Gender: M                 HR:           89 bpm. Exam Location:  Inpatient Procedure: Transesophageal Echo, Cardiac Doppler, Color Doppler and 3D Echo Indications:     Bacteremia  History:         Patient has prior history of Echocardiogram examinations, most                  recent 09/19/2019.  Sonographer:     Philipp Deputy Referring Phys:  [phone]  JILL D MCDANIEL Diagnosing Phys: Buford Dresser MD PROCEDURE: After discussion of the risks and benefits of a TEE, an informed consent was obtained from the patient. The  transesophogeal probe was passed without difficulty through the esophogus of the patient. Local oropharyngeal anesthetic was provided with Cetacaine. Sedation performed by different physician. The patient was monitored while under deep sedation. Anesthestetic sedation was provided intravenously by Anesthesiology: 675m of Propofol. Image quality was good. The patient's vital signs; including heart rate, blood pressure, and oxygen saturation; remained stable throughout the procedure. The patient developed no complications during the procedure. IMPRESSIONS  1. Left ventricular ejection fraction, by estimation, is 60 to 65%. The left ventricle has normal function. The left ventricle has no regional wall motion abnormalities.  2. Right ventricular systolic function is normal. The right ventricular size is normal.  3. No left atrial/left atrial appendage thrombus was detected.  4. There is a small portion of mild thickening of anterior leaflet, but no mobile density seen consistent with endocarditis.. The mitral valve is normal in structure. Trivial mitral valve regurgitation. No evidence of mitral stenosis.  5. There is a 0.66 cm by 0.31 cm lesion seen on the tricuspid valve, appears to be on septal leaflet. Best appreciated on 3D images.  6. The aortic valve is tricuspid. Aortic valve regurgitation is not visualized. No aortic stenosis is present.  7. Aortic Normal visualized aortic root, ascending aorta, and portion of descending aorta. Conclusion(s)/Recommendation(s): Findings are concerning for vegetation/infective endocarditis as detailed above. Findings consistent with tricuspid valve endocarditis. FINDINGS  Left Ventricle: Left ventricular ejection fraction, by estimation, is 60 to 65%. The left ventricle has normal function. The left ventricle has no regional wall motion abnormalities. The left ventricular internal cavity size was normal in size. There is  no left ventricular hypertrophy. Right Ventricle: The  right ventricular size is normal. No increase in right ventricular wall thickness. Right ventricular systolic function is normal. Left Atrium: Left atrial size was not well visualized. No left atrial/left atrial appendage thrombus was detected. Right Atrium: Right atrial size was not well visualized. Prominent Eustachian valve. Pericardium: There is no evidence of pericardial effusion. Mitral Valve: There is a small portion of mild thickening of anterior leaflet, but no mobile density seen consistent with endocarditis. The mitral valve is normal in structure. Trivial mitral valve regurgitation. No evidence of mitral valve stenosis. There is no evidence of mitral valve vegetation. Tricuspid Valve: There is a 0.66 cm by 0.31 cm lesion seen on the tricuspid valve, appears to be on septal leaflet. Best appreciated on 3D images. The tricuspid valve is normal in structure. Tricuspid valve regurgitation is trivial. No evidence of tricuspid stenosis. Aortic Valve: The aortic valve is tricuspid. Aortic valve regurgitation is not visualized. No aortic stenosis is present. There is no evidence of aortic valve vegetation. Pulmonic Valve: The pulmonic valve was grossly normal. Pulmonic valve regurgitation is trivial. Aorta: Normal visualized aortic root, ascending aorta, and portion of descending aorta. IAS/Shunts: No atrial level shunt detected by color flow Doppler. BBuford DresserMD Electronically signed by BBuford DresserMD Signature Date/Time: 09/24/2019/4:31:02 PM    Final    VAS [place]OWER EXTREMITY VENOUS (DVT)  Result Date: 09/22/2019  Lower Venous DVTStudy Indications: Swelling.  Comparison Study: No prior study on file Performing Technologist: CSharion DoveRVS  Examination Guidelines: A complete evaluation includes B-mode imaging, spectral Doppler, color Doppler, and power Doppler as needed of all accessible portions of each vessel. Bilateral testing is considered an integral  part of a complete  examination. Limited examinations for reoccurring indications may be performed as noted. The reflux portion of the exam is performed with the patient in reverse Trendelenburg.  +---------+---------------+---------+-----------+----------+--------------+ RIGHT    CompressibilityPhasicitySpontaneityPropertiesThrombus Aging +---------+---------------+---------+-----------+----------+--------------+ CFV      Full           Yes      Yes                                 +---------+---------------+---------+-----------+----------+--------------+ SFJ      Full                                                        +---------+---------------+---------+-----------+----------+--------------+ FV Prox  Full                                                        +---------+---------------+---------+-----------+----------+--------------+ FV Mid   Full                                                        +---------+---------------+---------+-----------+----------+--------------+ FV DistalFull                                                        +---------+---------------+---------+-----------+----------+--------------+ PFV      Full                                                        +---------+---------------+---------+-----------+----------+--------------+ POP      Full           Yes      Yes                                 +---------+---------------+---------+-----------+----------+--------------+ PTV      Full                                                        +---------+---------------+---------+-----------+----------+--------------+ PERO     Full                                                        +---------+---------------+---------+-----------+----------+--------------+   +---------+---------------+---------+-----------+----------+--------------+ LEFT     CompressibilityPhasicitySpontaneityPropertiesThrombus Aging  +---------+---------------+---------+-----------+----------+--------------+ CFV  Full           Yes      Yes                                 +---------+---------------+---------+-----------+----------+--------------+ SFJ      Full                                                        +---------+---------------+---------+-----------+----------+--------------+ FV Prox  Full                                                        +---------+---------------+---------+-----------+----------+--------------+ FV Mid   Full                                                        +---------+---------------+---------+-----------+----------+--------------+ FV DistalFull                                                        +---------+---------------+---------+-----------+----------+--------------+ PFV      Full                                                        +---------+---------------+---------+-----------+----------+--------------+ POP      Full           Yes      Yes                                 +---------+---------------+---------+-----------+----------+--------------+ PTV      Full                                                        +---------+---------------+---------+-----------+----------+--------------+ PERO     Full                                                        +---------+---------------+---------+-----------+----------+--------------+     Summary: BILATERAL: - No evidence of deep vein thrombosis seen in the lower extremities, bilaterally. - RIGHT: - Ultrasound characteristics of enlarged lymph nodes are noted in the groin.  LEFT: - Ultrasound characteristics of enlarged lymph nodes noted in the groin.  *See table(s) above for measurements and observations. Electronically signed by Monica Martinez MD on 09/22/2019  at 1:17:51 PM.    Final    [place] EKG SITE RITE  Result Date: 09/25/2019 If Site Rite image not attached, placement  could not be confirmed due to current cardiac rhythm.  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 Surgical decompression/evacuation 09/21/2019 EXAM: CTA ABDOMEN AND PELVIS WITHOUT AND WITH CONTRAST TECHNIQUE: Multidetector CT imaging of the abdomen and pelvis was performed using the standard protocol during bolus administration of intravenous contrast. Multiplanar reconstructed images and MIPs were obtained and reviewed to evaluate the vascular anatomy. CONTRAST:  11m OMNIPAQUE IOHEXOL 350 MG/ML SOLN COMPARISON:  09/17/2019, MR 09/20/2019 FINDINGS: VASCULAR Aorta: Unremarkable course, caliber, contour of the abdominal aorta. No dissection, aneurysm, or periaortic fluid. Celiac: Patent, with no significant atherosclerotic changes. SMA: Patent, with no significant atherosclerotic changes. Renals: - Right: There appears to be single right renal artery which is patent. - Left: There appears to be single left renal artery which is patent. IMA: Inferior mesenteric artery is patent. Right lower extremity: Unremarkable course, caliber, and contour of the right iliac system. No aneurysm, dissection, or occlusion. Hypogastric artery is patent. Common femoral artery patent. Proximal SFA and profunda femoris patent. Left lower extremity: Unremarkable course, caliber, and contour of the left iliac system. No aneurysm, dissection, or occlusion. Hypogastric artery is patent. Common femoral artery patent. Proximal SFA and profunda femoris patent. Veins: Portal: Portal branches are patent. The spleno portal confluence is patent. The splenic vein is patent. The superior mesenteric vein is patent. Systemic: Unremarkable appearance of the bilateral common femoral veins and iliac venous system. The IVC at the iliac confluence is patent. The infrarenal IVC again demonstrates  pedunculated thrombus at the posteromedial IVC, which is unchanged from the comparison CT. Additional thrombus at the level of the renal veins, nonocclusive. Hepatic IVC is patent. Review of the MIP images confirms the above findings. NON-VASCULAR Lower chest: Bilateral pleural effusions, new from the comparison. Associated atelectasis. Similar appearance of multiple nodular opacities of the visualized lower lungs. The largest is at the right costophrenic sulcus with small degree of internal cavitation. This lesion measures 19 mm. Hepatobiliary: Unremarkable appearance of the liver. Gallbladder is decompressed. Pancreas: Unremarkable. Spleen: Unremarkable. Adrenals/Urinary Tract: - Right adrenal gland: Unremarkable - Left adrenal gland: Unremarkable. - Right kidney: No hydronephrosis, nephrolithiasis, inflammation, or ureteral dilation. No focal lesion. - Left Kidney: No hydronephrosis, nephrolithiasis, inflammation, or ureteral dilation. No focal lesion. - Urinary Bladder: Unremarkable. Stomach/Bowel: - Stomach: Unremarkable. - Small bowel: Unremarkable - Appendix: Appendix is not visualized, however, no inflammatory changes are present adjacent to the cecum to indicate an appendicitis. - Colon: Unremarkable. Lymphatic: No adenopathy. Mesenteric: Edema throughout the mesenteric fat. Small volume low-density ascites within the abdomen. Edema of the body wall. Reproductive: Unremarkable appearance of the pelvic organs. Other: Surgical changes of the posterior paraspinal musculature, compatible with the given history of recent neuro surgical decompression. Musculoskeletal: No acute displaced fracture. Surgical changes of posterior paraspinal abscess decompression with gas in the soft tissues. 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 Negative for portal vein thrombus. Redemonstration of multifocal septic emboli of the visualized lower lungs. Body wall anasarca/edema and mesenteric edema with small volume ascites. Small pleural effusions. Correlation  with fluid status may be useful. Signed, Dulcy Fanny. Dellia Nims, RPVI [facility] Electronically Signed   By: Corrie Mckusick D.O.   On: 09/22/2019 12:03     Subjective: "I'm ready."  Discharge Exam: Vitals:   10/12/19 0428 10/12/19 0429  BP: 101/67   Pulse: 104   Resp:    Temp: 97.9 F (36.6 C) (!) 97.5 F (36.4 C)  SpO2: 99%    Vitals:   10/11/19 2051 10/11/19 2340 10/12/19 0428 10/12/19 0429  BP:  (!) 96/59 101/67   Pulse:  100 104   Resp:  19    Temp: (!) 100.8 F (38.2 C) 99.1 F (37.3 C) 97.9 F (36.6 C) (!) 97.5 F (36.4 C)  TempSrc: Axillary Axillary Oral Axillary  SpO2:  100% 99%   Weight:      Height:        General: 34 y.o. male resting in bed in NAD Cardiovascular: RRR, +S1, S2, no m/g/r, equal pulses throughout Respiratory: CTABL, no w/r/r, normal WOB GI: BS+, NDNT, no masses noted, no organomegaly noted MSK: No e/c/c Neuro: A&O x 3, no focal deficits Psyc: Appropriate interaction and affect, calm/cooperative    The results of significant diagnostics from this hospitalization (including imaging, microbiology, ancillary and laboratory) are listed below for reference.     Microbiology: No results found for this or any previous visit (from the past 240 hour(s)).   Labs: BNP (last 3 results) No results for input(s): BNP in the last 8760 hours. Basic Metabolic Panel: Recent Labs  Lab 10/06/19 1115 10/09/19 1031 10/10/19 0452 10/11/19 0500  NA 133* 133* 134* 133*  K 3.8 4.2 3.9 4.2  CL 97* 98 99 100  CO2 28 25 27 26   GLUCOSE 113* 93 105* 115*  BUN 13 12 12 12   CREATININE 0.89 0.92 0.91 0.98  CALCIUM 8.7* 8.8* 8.5*  8.6*  MG  --   --  1.7 1.5*  PHOS  --  3.1 4.2 4.1   Liver Function Tests: Recent Labs  Lab 10/09/19 1031 10/10/19 0452 10/11/19 0500  ALBUMIN 2.6* 2.5* 2.4*   No results for input(s): LIPASE, AMYLASE in the last 168 hours. No results for input(s): AMMONIA in the last 168 hours. CBC: Recent Labs  Lab 10/08/19 0450 10/10/19 0452 10/11/19 0500 10/12/19 0439  WBC 4.2 4.4 6.1 5.0  NEUTROABS 2.1 2.1 4.1 2.3  HGB 10.1* 10.2* 10.9* 11.3*  HCT 30.3* 30.8* 33.4* 35.0*  MCV 86.6 86.5 86.3 87.1  PLT 224 238 260 237   Cardiac Enzymes: No results for input(s): CKTOTAL, CKMB, CKMBINDEX, TROPONINI in the last 168 hours. BNP: Invalid input(s): POCBNP CBG: No results for input(s): GLUCAP in the last 168 hours. D-Dimer No results for input(s): DDIMER in the last 72 hours. Hgb A1c No results for input(s): HGBA1C in the last 72 hours. Lipid Profile No results for input(s): CHOL, HDL, LDLCALC, TRIG, CHOLHDL, LDLDIRECT in the last 72 hours. Thyroid function studies No results for input(s): TSH, T4TOTAL, T3FREE, THYROIDAB in the last 72 hours.  Invalid input(s): FREET3 Anemia work up No results for input(s): VITAMINB12, FOLATE, FERRITIN, TIBC, IRON, RETICCTPCT in the last 72 hours. Urinalysis No results found for: COLORURINE, APPEARANCEUR, LABSPEC, [place], GLUCOSEU, HGBUR, BILIRUBINUR, KETONESUR, PROTEINUR, UROBILINOGEN, NITRITE, LEUKOCYTESUR Sepsis Labs Invalid input(s): PROCALCITONIN,  WBC,  LACTICIDVEN Microbiology No results found for this or any previous visit (from the past 240 hour(s)).   Time coordinating discharge: 35 minutes  SIGNED:   Jiles Prows  A Marylyn Ishihara, DO  [facility] 10/12/2019, 7:38 AM   If 7PM-7AM, please contact night-coverage [email or web address]

## 2019-10-12 NOTE — Plan of Care (Signed)
Pt with d/c order to home

## 2019-10-15 ENCOUNTER — Telehealth: Payer: Self-pay

## 2019-10-15 MED ORDER — PREDNISONE 10 MG (21) PO TBPK
ORAL_TABLET | ORAL | 0 refills | Status: DC
Start: 2019-10-15 — End: 2019-10-15

## 2019-10-15 MED ORDER — PREDNISONE 10 MG (21) PO TBPK: ORAL_TABLET | ORAL | 0 refills | Status: AC

## 2019-10-15 NOTE — Telephone Encounter (Signed)
Patient calling with complaints of rash spreading to his face and near his eyes with swelling.  He has no complaints of blurred vision and states the itching is the same as at discharge. He states the rash changes has not caused any additional problems other than increased redness  in his thighs and legs.   His hospital follow up is not scheduled until October 24, 2019.  Please advise.   Laurell Josephs, RN

## 2019-10-15 NOTE — Telephone Encounter (Signed)
Prednisone was called to the [facility] in [place]  [phone].  [place] Joshi K Barron Vanloan,RN

## 2019-10-15 NOTE — Telephone Encounter (Signed)
Sorry I didn't make it clear.  He does not have any rash in his mouth or nose. I did ask in initial call.     His pharmacy is [facility] in [place].

## 2019-10-15 NOTE — Addendum Note (Signed)
Addended by: Laurell Josephs on: 10/15/2019 12:26 PM   Modules accepted: Orders

## 2019-10-15 NOTE — Telephone Encounter (Signed)
Can you have him clarify with me is he having any rash in his mouth or nose now? If he has rash in these locations I would encourage him to be seen at Urgent Care to get some blood work done and in person exam.   If he does not have any involvement in nose or mouth I will send in some prednisone for him to take to help calm down the redness. Can you verify which pharmacy to send to please.  Thank you.

## 2019-10-16 NOTE — Telephone Encounter (Signed)
Thank you so much [place]. Will follow up with him next week on rash.

## 2019-10-24 ENCOUNTER — Inpatient Hospital Stay: Payer: Medicaid Other | Admitting: Infectious Diseases

## 2019-11-18 ENCOUNTER — Inpatient Hospital Stay: Payer: Medicaid Other | Admitting: Infectious Diseases
